# Patient Record
Sex: Female | Born: 1970 | Race: White | Hispanic: Yes | Marital: Married | Smoking: Never smoker
Health system: Southern US, Community
[De-identification: ages and names within clinical notes are randomized; demographics above are authoritative.]

## PROBLEM LIST (undated history)

## (undated) DIAGNOSIS — R519 Headache, unspecified: Secondary | ICD-10-CM

## (undated) DIAGNOSIS — I10 Essential (primary) hypertension: Secondary | ICD-10-CM

## (undated) DIAGNOSIS — A048 Other specified bacterial intestinal infections: Secondary | ICD-10-CM

## (undated) DIAGNOSIS — K219 Gastro-esophageal reflux disease without esophagitis: Secondary | ICD-10-CM

## (undated) DIAGNOSIS — R51 Headache: Secondary | ICD-10-CM

## (undated) DIAGNOSIS — D649 Anemia, unspecified: Secondary | ICD-10-CM

## (undated) DIAGNOSIS — N6320 Unspecified lump in the left breast, unspecified quadrant: Secondary | ICD-10-CM

## (undated) HISTORY — PX: OTHER SURGICAL HISTORY: SHX169

## (undated) HISTORY — DX: Other specified bacterial intestinal infections: A04.8

## (undated) HISTORY — DX: Gastro-esophageal reflux disease without esophagitis: K21.9

## (undated) HISTORY — DX: Headache: R51

## (undated) HISTORY — PX: ABDOMINAL HYSTERECTOMY: SHX81

## (undated) HISTORY — DX: Anemia, unspecified: D64.9

## (undated) HISTORY — DX: Headache, unspecified: R51.9

---

## 1999-03-01 ENCOUNTER — Other Ambulatory Visit: Admission: RE | Admit: 1999-03-01 | Discharge: 1999-03-01 | Payer: Self-pay | Admitting: Family Medicine

## 2000-12-18 ENCOUNTER — Encounter: Admission: RE | Admit: 2000-12-18 | Discharge: 2000-12-18 | Payer: Self-pay | Admitting: Family Medicine

## 2000-12-18 ENCOUNTER — Encounter: Payer: Self-pay | Admitting: Family Medicine

## 2001-05-18 ENCOUNTER — Other Ambulatory Visit: Admission: RE | Admit: 2001-05-18 | Discharge: 2001-05-18 | Payer: Self-pay | Admitting: Obstetrics and Gynecology

## 2001-08-18 ENCOUNTER — Other Ambulatory Visit: Admission: RE | Admit: 2001-08-18 | Discharge: 2001-08-18 | Payer: Self-pay | Admitting: Obstetrics and Gynecology

## 2001-12-23 ENCOUNTER — Encounter: Payer: Self-pay | Admitting: General Surgery

## 2001-12-23 ENCOUNTER — Encounter: Admission: RE | Admit: 2001-12-23 | Discharge: 2001-12-23 | Payer: Self-pay | Admitting: General Surgery

## 2006-04-25 ENCOUNTER — Other Ambulatory Visit: Admission: RE | Admit: 2006-04-25 | Discharge: 2006-04-25 | Payer: Self-pay | Admitting: Gynecology

## 2013-07-05 ENCOUNTER — Encounter (HOSPITAL_COMMUNITY): Payer: Self-pay | Admitting: Emergency Medicine

## 2013-07-05 ENCOUNTER — Emergency Department (HOSPITAL_COMMUNITY)
Admission: EM | Admit: 2013-07-05 | Discharge: 2013-07-05 | Disposition: A | Discharge status: Home / Self Care | Payer: BC Managed Care – PPO | Attending: Emergency Medicine | Admitting: Emergency Medicine

## 2013-07-05 DIAGNOSIS — N3090 Cystitis, unspecified without hematuria: Secondary | ICD-10-CM

## 2013-07-05 DIAGNOSIS — R110 Nausea: Secondary | ICD-10-CM | POA: Insufficient documentation

## 2013-07-05 DIAGNOSIS — Z90710 Acquired absence of both cervix and uterus: Secondary | ICD-10-CM | POA: Insufficient documentation

## 2013-07-05 LAB — URINE MICROSCOPIC-ADD ON

## 2013-07-05 LAB — URINALYSIS, ROUTINE W REFLEX MICROSCOPIC
Bilirubin Urine: NEGATIVE
Glucose, UA: NEGATIVE mg/dL
Ketones, ur: 15 mg/dL — AB
Nitrite: POSITIVE — AB
Protein, ur: 100 mg/dL — AB
Specific Gravity, Urine: 1.012 (ref 1.005–1.030)
Urobilinogen, UA: 1 mg/dL (ref 0.0–1.0)
pH: 5 (ref 5.0–8.0)

## 2013-07-05 MED ORDER — IBUPROFEN 200 MG PO TABS
400.0000 mg | ORAL_TABLET | Freq: Once | ORAL | Status: AC
  Administered 2013-07-05: 400 mg via ORAL
  Filled 2013-07-05: qty 2

## 2013-07-05 MED ORDER — ONDANSETRON 4 MG PO TBDP: ORAL_TABLET | ORAL | Status: DC

## 2013-07-05 MED ORDER — PHENAZOPYRIDINE HCL 200 MG PO TABS: 200.0000 mg | ORAL_TABLET | Freq: Three times a day (TID) | ORAL | Status: DC

## 2013-07-05 MED ORDER — ONDANSETRON 4 MG PO TBDP
4.0000 mg | ORAL_TABLET | Freq: Once | ORAL | Status: AC
  Administered 2013-07-05: 4 mg via ORAL
  Filled 2013-07-05: qty 1

## 2013-07-05 MED ORDER — NITROFURANTOIN MONOHYD MACRO 100 MG PO CAPS: 100.0000 mg | ORAL_CAPSULE | Freq: Two times a day (BID) | ORAL | Status: DC

## 2013-07-05 NOTE — ED Notes (Signed)
This RN spoke with lab, who states that the pt's urine sample opened while in transit, and they need another sample.

## 2013-07-05 NOTE — ED Provider Notes (Signed)
CSN: [id number]     Arrival date & time 07/05/13  0408 History   First MD Initiated Contact with Patient 07/05/13 [id number]     Chief Complaint  Patient presents with  . Urinary Tract Infection     (Consider location/radiation/quality/duration/timing/severity/associated sxs/prior Treatment) HPI Patient is a 43 year old female who presents with dysuria, frequency and suprapubic pain starting last evening. She's had mild nausea. She's had no vomiting. She has no fevers or chills. She has no flank pain. Patient denies any vaginal symptoms. She states she's had UTIs in the past with similar symptoms. History reviewed. No pertinent past medical history. Past Surgical History  Procedure Laterality Date  . Abdominal hysterectomy  Pt states 10 years ago.  . Cesarean section     History reviewed. No pertinent family history. History  Substance Use Topics  . Smoking status: Never Smoker   . Smokeless tobacco: Not on file  . Alcohol Use: No   OB History   Grav Para Term Preterm Abortions TAB SAB Ect Mult Living                 Review of Systems  Constitutional: Negative for fever and chills.  Gastrointestinal: Positive for nausea. Negative for vomiting, abdominal pain, diarrhea and constipation.  Genitourinary: Positive for dysuria and frequency. Negative for hematuria, flank pain, vaginal bleeding, vaginal discharge and pelvic pain.  Musculoskeletal: Negative for back pain, myalgias, neck pain and neck stiffness.  Skin: Negative for rash and wound.  Neurological: Negative for dizziness, weakness, light-headedness, numbness and headaches.  All other systems reviewed and are negative.     Allergies  Review of patient's allergies indicates no known allergies.  Home Medications   Prior to Admission medications   Not on File   BP 135/90  Pulse 78  Temp(Src) 98.6 F (37 C) (Oral)  Ht 5\' 4"  (1.626 m)  Wt 150 lb (68.04 kg)  BMI 25.73 kg/m2  SpO2 98% Physical Exam  Nursing note and  vitals reviewed. Constitutional: She is oriented to person, place, and time. She appears well-developed and well-nourished. No distress.  HENT:  Head: Normocephalic and atraumatic.  Mouth/Throat: Oropharynx is clear and moist.  Eyes: EOM are normal. Pupils are equal, round, and reactive to light.  Neck: Normal range of motion. Neck supple.  Cardiovascular: Normal rate and regular rhythm.   Pulmonary/Chest: Effort normal and breath sounds normal. No respiratory distress. She has no wheezes. She has no rales.  Abdominal: Soft. Bowel sounds are normal. She exhibits no distension and no mass. There is tenderness (mild suprapubic tenderness.). There is no rebound and no guarding.  Musculoskeletal: Normal range of motion. She exhibits no edema and no tenderness.  No CVA tenderness bilaterally.  Neurological: She is alert and oriented to person, place, and time.  Skin: Skin is warm and dry. No rash noted. No erythema.  Psychiatric: She has a normal mood and affect. Her behavior is normal.    ED Course  Procedures (including critical care time) Labs Review Labs Reviewed  URINALYSIS, ROUTINE W REFLEX MICROSCOPIC - Abnormal; Notable for the following:    Color, Urine ORANGE (*)    APPearance TURBID (*)    Hgb urine dipstick LARGE (*)    Ketones, ur 15 (*)    Protein, ur 100 (*)    Nitrite POSITIVE (*)    Leukocytes, UA LARGE (*)    All other components within normal limits  URINE MICROSCOPIC-ADD ON - Abnormal; Notable for the following:    Bacteria,  UA FEW (*)    All other components within normal limits    Imaging Review No results found.   EKG Interpretation None      MDM   Final diagnoses:  Cystitis   We'll treat patient for urinary tract infection. Return precautions given     Loren Raceravid Rhilee Currin, MD 07/13/13 [id number]

## 2013-07-05 NOTE — Discharge Instructions (Signed)
Urinary Tract Infection  Urinary tract infections (UTIs) can develop anywhere along your urinary tract. Your urinary tract is your body's drainage system for removing wastes and extra water. Your urinary tract includes two kidneys, two ureters, a bladder, and a urethra. Your kidneys are a pair of bean-shaped organs. Each kidney is about the size of your fist. They are located below your ribs, one on each side of your spine.  CAUSES  Infections are caused by microbes, which are microscopic organisms, including fungi, viruses, and bacteria. These organisms are so small that they can only be seen through a microscope. Bacteria are the microbes that most commonly cause UTIs.  SYMPTOMS   Symptoms of UTIs may vary by age and gender of the patient and by the location of the infection. Symptoms in young women typically include a frequent and intense urge to urinate and a painful, burning feeling in the bladder or urethra during urination. Older women and men are more likely to be tired, shaky, and weak and have muscle aches and abdominal pain. A fever may mean the infection is in your kidneys. Other symptoms of a kidney infection include pain in your back or sides below the ribs, nausea, and vomiting.  DIAGNOSIS  To diagnose a UTI, your caregiver will ask you about your symptoms. Your caregiver also will ask to provide a urine sample. The urine sample will be tested for bacteria and white blood cells. White blood cells are made by your body to help fight infection.  TREATMENT   Typically, UTIs can be treated with medication. Because most UTIs are caused by a bacterial infection, they usually can be treated with the use of antibiotics. The choice of antibiotic and length of treatment depend on your symptoms and the type of bacteria causing your infection.  HOME CARE INSTRUCTIONS   If you were prescribed antibiotics, take them exactly as your caregiver instructs you. Finish the medication even if you feel better after you  have only taken some of the medication.   Drink enough water and fluids to keep your urine clear or pale yellow.   Avoid caffeine, tea, and carbonated beverages. They tend to irritate your bladder.   Empty your bladder often. Avoid holding urine for long periods of time.   Empty your bladder before and after sexual intercourse.   After a bowel movement, women should cleanse from front to back. Use each tissue only once.  SEEK MEDICAL CARE IF:    You have back pain.   You develop a fever.   Your symptoms do not begin to resolve within 3 days.  SEEK IMMEDIATE MEDICAL CARE IF:    You have severe back pain or lower abdominal pain.   You develop chills.   You have nausea or vomiting.   You have continued burning or discomfort with urination.  MAKE SURE YOU:    Understand these instructions.   Will watch your condition.   Will get help right away if you are not doing well or get worse.  Document Released: 12/12/2004 Document Revised: 09/03/2011 Document Reviewed: 04/12/2011  [employer] Patient Information 2014 [employer]

## 2013-07-05 NOTE — ED Notes (Signed)
Pt states that she started having pain with urination and blood in her urine last night. Pt denies flank pain.

## 2013-11-02 ENCOUNTER — Ambulatory Visit (INDEPENDENT_AMBULATORY_CARE_PROVIDER_SITE_OTHER): Payer: BC Managed Care – PPO | Admitting: Gynecology

## 2013-11-02 ENCOUNTER — Encounter: Payer: Self-pay | Admitting: Gynecology

## 2013-11-02 ENCOUNTER — Other Ambulatory Visit (HOSPITAL_COMMUNITY)
Admission: RE | Admit: 2013-11-02 | Discharge: 2013-11-02 | Disposition: A | Discharge status: Home / Self Care | Payer: BC Managed Care – PPO | Source: Ambulatory Visit | Attending: Gynecology | Admitting: Gynecology

## 2013-11-02 ENCOUNTER — Telehealth: Payer: Self-pay | Admitting: *Deleted

## 2013-11-02 VITALS — BP 118/74 | Ht 62.75 in | Wt 151.4 lb

## 2013-11-02 DIAGNOSIS — N390 Urinary tract infection, site not specified: Secondary | ICD-10-CM

## 2013-11-02 DIAGNOSIS — N3281 Overactive bladder: Secondary | ICD-10-CM

## 2013-11-02 DIAGNOSIS — Z1151 Encounter for screening for human papillomavirus (HPV): Secondary | ICD-10-CM | POA: Insufficient documentation

## 2013-11-02 DIAGNOSIS — N760 Acute vaginitis: Secondary | ICD-10-CM | POA: Insufficient documentation

## 2013-11-02 DIAGNOSIS — R229 Localized swelling, mass and lump, unspecified: Secondary | ICD-10-CM

## 2013-11-02 DIAGNOSIS — R2232 Localized swelling, mass and lump, left upper limb: Secondary | ICD-10-CM

## 2013-11-02 DIAGNOSIS — Z01419 Encounter for gynecological examination (general) (routine) without abnormal findings: Secondary | ICD-10-CM | POA: Diagnosis not present

## 2013-11-02 DIAGNOSIS — R351 Nocturia: Secondary | ICD-10-CM

## 2013-11-02 DIAGNOSIS — N6310 Unspecified lump in the right breast, unspecified quadrant: Secondary | ICD-10-CM

## 2013-11-02 DIAGNOSIS — N318 Other neuromuscular dysfunction of bladder: Secondary | ICD-10-CM

## 2013-11-02 DIAGNOSIS — H109 Unspecified conjunctivitis: Secondary | ICD-10-CM

## 2013-11-02 DIAGNOSIS — N63 Unspecified lump in unspecified breast: Secondary | ICD-10-CM

## 2013-11-02 LAB — CBC WITH DIFFERENTIAL/PLATELET
BASOS ABS: 0 10*3/uL (ref 0.0–0.1)
BASOS PCT: 0 % (ref 0–1)
EOS PCT: 1 % (ref 0–5)
Eosinophils Absolute: 0.1 10*3/uL (ref 0.0–0.7)
HEMATOCRIT: 40.9 % (ref 36.0–46.0)
Hemoglobin: 14.1 g/dL (ref 12.0–15.0)
LYMPHS PCT: 37 % (ref 12–46)
Lymphs Abs: 2.1 10*3/uL (ref 0.7–4.0)
MCH: 30.1 pg (ref 26.0–34.0)
MCHC: 34.5 g/dL (ref 30.0–36.0)
MCV: 87.2 fL (ref 78.0–100.0)
Monocytes Absolute: 0.4 10*3/uL (ref 0.1–1.0)
Monocytes Relative: 7 % (ref 3–12)
Neutro Abs: 3.1 10*3/uL (ref 1.7–7.7)
Neutrophils Relative %: 55 % (ref 43–77)
Platelets: 246 10*3/uL (ref 150–400)
RBC: 4.69 MIL/uL (ref 3.87–5.11)
RDW: 13.5 % (ref 11.5–15.5)
WBC: 5.6 10*3/uL (ref 4.0–10.5)

## 2013-11-02 LAB — URINALYSIS W MICROSCOPIC + REFLEX CULTURE
Bilirubin Urine: NEGATIVE
GLUCOSE, UA: NEGATIVE mg/dL
Hgb urine dipstick: NEGATIVE
Ketones, ur: NEGATIVE mg/dL
Leukocytes, UA: NEGATIVE
NITRITE: NEGATIVE
Protein, ur: NEGATIVE mg/dL
Specific Gravity, Urine: <1.005 — ABNORMAL LOW (ref 1.005–1.030)
UROBILINOGEN UA: 0.2 mg/dL (ref 0.0–1.0)
pH: 7 (ref 5.0–8.0)

## 2013-11-02 LAB — COMPREHENSIVE METABOLIC PANEL
ALT: 26 U/L (ref 0–35)
AST: 26 U/L (ref 0–37)
Albumin: 4.5 g/dL (ref 3.5–5.2)
Alkaline Phosphatase: 40 U/L (ref 39–117)
BUN: 9 mg/dL (ref 6–23)
CHLORIDE: 103 meq/L (ref 96–112)
CO2: 28 mEq/L (ref 19–32)
CREATININE: 0.61 mg/dL (ref 0.50–1.10)
Calcium: 9.7 mg/dL (ref 8.4–10.5)
Glucose, Bld: 84 mg/dL (ref 70–99)
Potassium: 4.9 mEq/L (ref 3.5–5.3)
Sodium: 138 mEq/L (ref 135–145)
Total Bilirubin: 0.5 mg/dL (ref 0.2–1.2)
Total Protein: 7.4 g/dL (ref 6.0–8.3)

## 2013-11-02 LAB — WET PREP FOR TRICH, YEAST, CLUE
Clue Cells Wet Prep HPF POC: NONE SEEN
Trich, Wet Prep: NONE SEEN

## 2013-11-02 LAB — CHOLESTEROL, TOTAL: CHOLESTEROL: 189 mg/dL (ref 0–200)

## 2013-11-02 MED ORDER — AZITHROMYCIN 1 % OP SOLN: OPHTHALMIC | Status: DC

## 2013-11-02 NOTE — Telephone Encounter (Signed)
Message copied by Aura CampsWEBB, Malavika Lira L on Tue Nov 02, 2013  2:32 PM ------      Message from: Ok EdwardsFERNANDEZ, JUAN H      Created: Tue Nov 02, 2013 11:40 AM       Please [facility] diagnostic mamm[facility]:            Right Breast mass 2 cm in size firm and tender 1-3 o'clock position periareolar region.            Also, need appointment with general surgeon for soft tissue mass left shoulder area to rule out lymphoma ------

## 2013-11-02 NOTE — Patient Instructions (Addendum)
Conjuntivitis (Conjunctivitis) Usted padece conjuntivitis. La conjuntivitis se conoce frecuentemente como "ojo rojo". Las causas de la conjuntivitis pueden ser las infecciones virales o [place], Environmental consultant o lesiones. Los sntomas son: enrojecimiento de la superficie del ojo, picazn, molestias y en algunos casos, secreciones. La secrecin se deposita en las pestaas. Las infecciones virales causan una secrecin acuosa, mientras que las infecciones bacterianas causan una secrecin amarillenta y espesa. La conjuntivitis es muy contagiosa y se disemina por el contacto directo. [employer] Energy parte del tratamiento le indicaran gotas oftlmicas con antibiticos. Antes de [employer] Corporation, retire todas la secreciones del ojo, lavndolo suavemente con agua tibia y algodn. Contine con el uso del medicamento hasta que se haya Entergy Corporation sin secrecin ocular. No se frote los ojos. Esto hace que aumente la irritacin y favorece la extensin de la infeccin. No utilice las Lear Corporation miembros de [place]. Lvese las manos con agua y [place] antes y despus de tocarse los ojos. Utilice compresas fras para reducir Chief Technology Officer y anteojos de sol para disminuir la irritacin que ocasiona la luz. No debe usarse maquillaje ni lentes de contacto hasta que la infeccin haya desaparecido. SOLICITE ATENCIN MDICA SI:  Sus sntomas no mejoran luego de [address]  [address] de [place].  Aumenta el dolor o las dificultades para ver.  La zona externa de los prpados est muy roja o hinchada. Document Released: 03/04/2005 Document Revised: 05/27/2011 [facility] Patient Information 2015 [place], [place]. This information is not intended to replace advice given to you by your health care provider. Make sure you discuss any questions you have with your health care provider. Autoexamen de [employer]  Chief Executive Officer) El autoexamen de mamas puede detectar problemas de manera temprana, prevenir complicaciones mdicas  significativas y posiblemente salvar su vida. Al hacerlo, podr familiarizarse con el aspecto y forma de sus [place], y observar cambios. Esto le permite descubrir cambios de manera precoz. Este autoexamen Murphy Oil ofrece la tranquilidad de que sus senos estn en buen [place] de [place]. Una forma de aprender qu es normal para sus mamas y si sufren modificaciones es Radio producer un autoexamen.   Si encuentra un bulto o algo que no estaba presente anteriormente, lo mejor es ponerse en contacto con su mdico inmediatamente. Otro hallazgo que debe ser evaluado por su mdico es la secrecin del pezn, especialmente si es con sangre; cambios en la piel o enrojecimiento; reas donde la piel parece estar tironeada (retrada) o nuevos bultos o protuberancias. El dolor en los senos es rara vez se asocia con el cncer (malignidad), pero tambin debe ser evaluado por un mdico.  CMO REALIZAR EL AUTOEXAMEN DE MAMAS  El mejor momento para examinar sus mamas es a los 5 a 7 das despus de finalizado el perodo menstrual. Durante la menstruacin, las mamas estn ms abultadas y puede haber ms dificultad para Clinical research associate modificaciones. Si no menstra, ha llegado a la menopausia, o le han extirpado el tero (histerectomia), usted debe examinar sus senos a intervalos regulares, por ejemplo cada mes. Si est amamantando, examine sus senos despus de alimentar al beb o despus de usar un extractor de [place]. Los implantes mamarios no disminuyen el riesgo de bultos o tumores, por lo que debe seguir realizando el autoexamen de [employer] se recomienda. Hable con su mdico acerca de cmo determinar la diferencia entre el implante y el tejido [place]. [place]ems, debe consultar cuanta presin debe hacer durante el examen. Con el tiempo se familiarizar con las variaciones de las [place] y se sentir  ms cmoda para Horticulturist, commercial. Para el autoexamen deber quitarse toda la ropa de la cintura para [place].  1.  Observe sus senos y pezones. Prese  frente a un espejo en una habitacin con buena iluminacin. Con las [employer] caderas, presione las manos firmemente [place]. Busque diferencias en la forma, el contorno y el tamao de un pecho al otro (asimetras). Entre las asimetras se incluyen arrugas, depresiones o protuberancias. Tambin, busque cambios en la piel, como reas enrojecidas o escamosas. Busque cambios en los pezones, como secreciones, hoyuelos, cambios en la posicin, o enrojecimiento. 2. [place] cuidadosamente sus senos. Es mucho mejor D[employer] Restaurants en la ducha o en la baera, [place] jabn o cuando est recostada sobre su espalda. Coloque el brazo (en el lado de la mama que se examina) por arriba de la cabeza. Use las yemas (no las puntas) de los tres dedos centrales de la mano opuesta para palpar. Comience en la zona de la axila, haga crculos de  de pulgada (2 cm) y vaya superponindolos. Utilice 3 niveles diferentes de presin (ligero, medio y [place]) en cada crculo antes de pasar al siguiente. Se necesita una presin ligera para sentir los tejidos ms cercanos a la piel. La presin media ayudar a sentir el tejido [employer]apeake Energy un poco ms profundo, mientras que se necesita una presin firme para palpar el tejido que se encuentra cerca de las [place]. Continuar superponiendo crculos y vaya hacia abajo, hasta sentir las [place], por debajo del [place]. Luego mueva un espacio del ancho de un dedo hacia el centro del cuerpo. Siga con los crculos del  de pulgada (2 cm) mientras va lentamente hacia la clavcula, cerca de la base del cuello. Contine con el examen hacia arriba y hacia abajo con las 3 intensidades de presin Civil Service fast streamer a la mitad del pecho. Hgalo con cada seno cuidadosamente, buscando bultos o modificaciones. 3. Debe llevar un registro escrito con los cambios o los hallazgos normales que encuentre para cada seno. Si registra esta informacin, no tiene que depender slo de la memoria para Designer, industrial/product, la  sensibilidad o la ubicacin de los Brillion. [place] en qu momento se encuentra del ciclo menstrual, si usted todava est menstruando. El tejido [employer]apeake Energy puede tener algunos bultos o tejidos engrosados. Sin embargo, consulte a su mdico si usted Animal nutritionist.   SOLICITE ATENCIN MDICA SI:   Observa cambios en la forma, en el contorno o el tamao de las mamas o los pezones.   Hay modificaciones en la piel, como zonas enrojecidas o escamosas en las mamas o en los pezones.   Tiene una secrecin anormal en los pezones.   Siente un nuevo bulto o reas engrosadas de [place] anormal.  Document Released: 03/04/2005 Document Revised: 02/19/2012 [facility] Patient Information 2015 [place], [place]. This information is not intended to replace advice given to you by your health care provider. Make sure you discuss any questions you have with your health care provider.  Poliaquiuria  (Urinary Frequency)  El nmero de veces que orina una persona sana depende de la cantidad de lquido que ingiere y la cantidad de lquido que pierde. Cuando hace calor y hay mucha humedad, la persona transpira ms y la respiracin es ms rpida. Estos factores disminuyen la frecuencia de la miccin que se considera normal.  La cantidad de lquido que se bebe es fcil de Chief Strategy Officer, pero la cantidad de lquido que se pierde es ms difcil de calcular.  El lquido se pierde  en dos formas:   La prdida perceptible de lquidos se mide por la cantidad de orina que se elimina. Tambin hay prdida de lquido tambin con la diarrea.  La prdida imperceptible de lquidos es ms difcil de medir. La causa es la evaporacin. La prdida insensible de lquido se produce a travs de la respiracin y la sudoracin. Por lo general oscila alrededor de Building services engineer. En temperaturas y niveles de [place] normales una persona promedio puede orinar 4-7 veces en un perodo de 24 horas. La necesidad de orinar con ms  frecuencia podra indicar que hay un problema. Si una persona orina entre 4 y 7 veces en 24 horas y elimina grandes volmenes cada vez, podra indicar un problema diferente del que orine 4 a 7 veces al da y elimine pequeos volmenes. El momento en que orina tambin es importante. Generalmente se [employer] de vigilia. Levantarse con frecuencia por la noche puede indicar algunos problemas.  CAUSAS  La vejiga es el rgano que se encuentra en la zona baja del abdomen y que contiene la [place]. Como un globo, se hincha un poco a medida que se llena. Las terminales nerviosas lo perciben y envan la informacin de que es hora de ir al bao. Hay una serie de causas por las que se siente la necesidad de Geographical information systems officer con ms frecuencia de lo habitual. Ellos son:   Infeccin del tracto urinario. Esto generalmente se asocia con otros sntomas tales como ardor al [employer]ocoPhillips.  En hombres, los problemas de la prstata (una glndula del tamao de una nuez que se encuentra cerca del conducto por el que se elimina la orina del organismo). Hay dos razones por las que la prstata puede causar un aumento en la frecuencia de la miccin:  El agrandamiento de la prstata que no deja que la vejiga se vace bien. Si la vejiga se vaca a medias slo tiene la mitad de la capacidad de llenarla antes de orinar de [place].  Los nervios de la vejiga se vuelven ms hipersensibles a un aumento del tamao de la prstata, incluso si la vejiga se vaca completamente.  Embarazo.  Obesidad. El exceso de peso causa ms problemas en las mujeres que en los hombres.  Clculos u otros problemas en la vejiga.  La cafena.  Alcohol.  Medicamentos. Por ejemplo, los medicamentos que permiten eliminar el exceso de lquido del organismo (diurticos) aumentan la produccin de [place]. Algunos medicamentos deben tomarse con mucho lquido.  Debilidad muscular o nerviosa. Podra ser el resultado de una lesin en la mdula espinal, un ictus,  esclerosis mltiple o la enfermedad de Parkinson.  Cuando se ha sufrido diabetes durante mucho tiempo, puede haber una disminucin de la sensacin de la vejiga. Esta prdida de sensibilidad hace que sea ms difcil detectar que hay que vaciar la vejiga. Durante muchos aos la vejiga se expande por sobrellenado constante. Esto debilita sus msculos de modo que la vejiga no se vaca bien y tiene menos capacidad para llenarse nuevamente.  Cistitis intersticial (tambin llamado sndrome de vejiga dolorosa). La enfermedad se desarrolla debido a que los tejidos que recubren el interior de la vejiga se inflaman (la inflamacin es la manera que tiene el organismo de Publishing rights manager a una lesin o infeccin). Causa dolor y necesidad frecuente de Geographical information systems officer. Ocurre con ms frecuencia en mujeres que en hombres. DIAGNSTICO   Para diagnosticar la causa de la poliaquiuria, el mdico:  Le preguntar [employer] Station[employer] sntomas.  Preguntar acerca de su salud en  general. Incluir preguntas sobre los medicamentos que est tomando.  Le har un examen fsico.  Indicar algunos estudios. Estos pueden incluir:  Un anlisis de sangre para Engineer, manufacturing diabetes u otros problemas de salud que podran estar contribuyendo al problema.  Anlisis de [place]. Anlisis de [place] para medir el flujo de [place] y la presin sobre la vejiga.  Estudios del sistema neurolgico (el cerebro, la mdula espinal y los nervios). Este es el sistema que detecta la sensacin de Geographical information systems officer.  Estudios de la vejiga para verificar si se vaca por completo al [employer]ocoPhillips.  [place]. En este estudio se [place] un tubo delgado con una pequea cmara. Podr observarse el interior de la uretra y la vejiga para ver si hay problemas.  Diagnstico por imgenes. Le administrarn una sustancia de contraste y [employer] pedirn que orine Luego se toman radiografas de la vejiga para ver su funcionamiento. TRATAMIENTO  Es importante que sea evaluado para determinar si la cantidad  o la frecuencia con la que orina es anormal o inusual. Si se comprueba que no es normal, debe determinarse la causa y por lo general se puede hallar con facilidad. Segn sea la causa, el tratamiento podra incluir medicamentos, la estimulacin de los nervios o [place].  No hay muchas cosas que usted pueda hacer por su cuenta para modificar la poliaquiuria. Es importante equilibrar la ingestin de lquidos necesaria para compensar su actividad y Retail buyer. Los problemas mdicos sern diagnosticados y tratados por su mdico. No hay entrenamiento de la vejiga en particular, como los ejercicios de [place] que usted pueda hacer para mejorar la poliaquiuria. Este es un ejercicio que normalmente deben Ameren Corporation personas que tienen prdidas de [place] cuando se ren, tosen o estornudan.  INSTRUCCIONES PARA EL CUIDADO EN EL HOGAR   Tome todos los medicamentos que su mdico le ha recetado o sugerido. Siga cuidadosamente las indicaciones.  Realice cambios en su estilo de vida, segn las indicaciones. Estos pueden incluir:  Product manager menos lquidos o beber en diferentes momentos del da. Por ejemplo, si necesita orinar con frecuencia durante la noche, deber dejar de tomar lquidos temprano a la noche.  Reducir el consumo de cafena o de alcohol. Ambos aumentan la necesidad de orinar ms de lo normal. La cafena se encuentra en las gaseosas, el t y el chocolate.  Baje de peso, si se lo indican.  Lleve un diario o registro. Posiblemente le pedirn que registre la cantidad que bebe y [place], y el momento en que sienta la necesidad de Geographical information systems officer. Esto tambin lo ayudar a evaluar si el tratamiento que le ha indicado el mdico funciona. SOLICITE ATENCIN MDICA SI:   La necesidad de orinar con frecuencia aumenta.  Se siente ms dolor o irritacin al [employer]oco[employer]illips.  Observa sangre en la orina.  Usted tiene preguntas sobre cualquier medicamento que su mdico ha recomendado.  Observa sangre, pus, aumento de la  [employer] lugares en que le hicieron las pruebas o el [place].  La temperatura eleva por encima de 100,68F (38,1C). SOLICITE ATENCIN MDICA DE INMEDIATO SI:  La temperatura eleva a ms de 102,68F (38,9C).  Document Released: 11/27/2011 [facility] Patient Information 2015 [place], [place]. This information is not intended to replace advice given to you by your health care provider. Make sure you discuss any questions you have with your health care provider.  Tdap Vaccine (Tetanus, Diphtheria, Pertussis): What You Need to Know 1. Why get vaccinated? Tetanus, diphtheria and pertussis can be very serious diseases, even for adolescents and  adults. Tdap vaccine can protect [place] from these diseases. TETANUS (Lockjaw) causes painful muscle tightening and stiffness, usually all over the body.  It can lead to tightening of muscles in the head and neck so you can't open your mouth, swallow, or sometimes even breathe. Tetanus kills about 1 out of 5 people who are infected. DIPHTHERIA can cause a thick coating to form in the back of the throat.  It can lead to breathing problems, paralysis, heart failure, and death. PERTUSSIS (Whooping Cough) causes severe coughing spells, which can cause difficulty breathing, vomiting and disturbed sleep.  It can also lead to weight loss, incontinence, and rib fractures. Up to 2 in 100 adolescents and 5 in 100 adults with pertussis are hospitalized or have complications, which could include pneumonia or death. These diseases are caused by bacteria. Diphtheria and pertussis are spread from person to person through coughing or sneezing. Tetanus enters the body through cuts, scratches, or wounds. Before vaccines, the [place] States saw as many as 200,000 cases a year of diphtheria and pertussis, and hundreds of cases of tetanus. Since vaccination began, tetanus and diphtheria have dropped by about 99% and pertussis by about 80%. 2. Tdap vaccine Tdap vaccine can  protect adolescents and adults from tetanus, diphtheria, and pertussis. One dose of Tdap is routinely given at age 36 or 42. People who did not get Tdap at that age should get it as soon as possible. Tdap is especially important for health care professionals and anyone having close contact with a baby younger than 12 months. Pregnant women should get a dose of Tdap during every pregnancy, to protect the newborn from pertussis. Infants are most at risk for severe, life-threatening complications from pertussis. A similar vaccine, called Td, protects from tetanus and diphtheria, but not pertussis. A Td booster should be given every 10 years. Tdap may be given as one of these boosters if you have not already gotten a dose. Tdap may also be given after a severe cut or burn to prevent tetanus infection. Your doctor can give you more information. Tdap may safely be given at the same time as other vaccines. 3. Some people should not get this vaccine  If you ever had a life-threatening allergic reaction after a dose of any tetanus, diphtheria, or pertussis containing vaccine, OR if you have a severe allergy to any part of this vaccine, you should not get Tdap. Tell your doctor if you have any severe allergies.  If you had a coma, or long or multiple seizures within 7 days after a childhood dose of DTP or DTaP, you should not get Tdap, unless a cause other than the vaccine was found. You can still get Td.  Talk to your doctor if you:  have epilepsy or another nervous system problem,  had severe pain or swelling after any vaccine containing diphtheria, tetanus or pertussis,  ever had Guillain-Barr Syndrome (GBS),  aren't feeling well on the day the shot is scheduled. 4. Risks of a vaccine reaction With any medicine, including vaccines, there is a chance of side effects. These are usually mild and go away on their own, but serious reactions are also possible. Brief fainting spells can follow a  vaccination, leading to injuries from falling. Sitting or lying down for about 15 minutes can help prevent these. Tell your doctor if you feel dizzy or light-headed, or have vision changes or ringing in the ears. Mild problems following Tdap (Did not interfere with activities)  Pain where the shot  was given (about 3 in 4 adolescents or 2 in 3 adults)  Redness or swelling where the shot was given (about 1 person in 5)  Mild fever of at least 100.77F (up to about 1 in 25 adolescents or 1 in 100 adults)  Headache (about 3 or 4 people in 10)  Tiredness (about 1 person in 3 or 4)  Nausea, vomiting, diarrhea, stomach ache (up to 1 in 4 adolescents or 1 in 10 adults)  Chills, body aches, sore joints, rash, swollen glands (uncommon) Moderate problems following Tdap (Interfered with activities, but did not require medical attention)  Pain where the shot was given (about 1 in 5 adolescents or 1 in 100 adults)  Redness or swelling where the shot was given (up to about 1 in 16 adolescents or 1 in 25 adults)  Fever over 102F (about 1 in 100 adolescents or 1 in 250 adults)  Headache (about 3 in 20 adolescents or 1 in 10 adults)  Nausea, vomiting, diarrhea, stomach ache (up to 1 or 3 people in 100)  Swelling of the entire arm where the shot was given (up to about 3 in 100). Severe problems following Tdap (Unable to perform usual activities; required medical attention)  Swelling, severe pain, bleeding and redness in the arm where the shot was given (rare). A severe allergic reaction could occur after any vaccine (estimated less than 1 in a million doses). 5. What if there is a serious reaction? What should I look for?  Look for anything that concerns you, such as signs of a severe allergic reaction, very high fever, or behavior changes. Signs of a severe allergic reaction can include hives, swelling of the face and throat, difficulty breathing, a fast heartbeat, dizziness, and weakness.  These would start a few minutes to a few hours after the vaccination. What should I do?  If you think it is a severe allergic reaction or other emergency that can't wait, call 9-1-1 or get the person to the nearest hospital. Otherwise, call your doctor.  Afterward, the reaction should be reported to the "Vaccine Adverse Event Reporting System" (VAERS). Your doctor might file this report, or you can do it yourself through the VAERS web site at www.vaers.LAgents.nohhs.gov, or by calling 1-931-850-6589. VAERS is only for reporting reactions. They do not give medical advice.  6. The National Vaccine Injury Compensation Program The Constellation Energyational Vaccine Injury Compensation Program (VICP) is a federal program that was created to compensate people who may have been injured by certain vaccines. Persons who believe they may have been injured by a vaccine can learn about the program and about filing a claim by calling 1-(207)215-4201 or visiting the VICP website at SpiritualWord.atwww.hrsa.gov/vaccinecompensation. 7. How can I learn more?  Ask your doctor.  Call your local or state health department.  Contact the Centers for Disease Control and Prevention (CDC):  Call (309) [phone])199-5107 or visit CDC's website at PicCapture.uywww.cdc.gov/vaccines. CDC Tdap Vaccine VIS (07/25/11) Document Released: 09/03/2011 Document Revised: 07/19/2013 Document Reviewed: 06/16/2013 ExitCare Patient Information 2015 [place]. This information is not intended to replace advice given to you by your health care provider. Make sure you discuss any questions you have with your health care provider.

## 2013-11-02 NOTE — Progress Notes (Signed)
Christie Stone 03/26/1970 [id number]   History:    43 y.o.  for annual gyn exam who has not been seen in the office since 2008. She has not been seen by any other provider since that time. She had several complaints today as follows:  Right eye irritation Left shoulder mass Vaginal discharge and irritation Urinary frequency and nocturia Past history of recurrent UTI.  Patient stated in 2008 she [place] secondary to dysmenorrhea, menorrhagia and fibroid uterus.  Patient stated that she had a [facility] in [facility] but cannot understand the report and any recommendations for followup studies because of language barrier.  Past medical history,surgical history, family history and social history were all reviewed and documented in the EPIC chart.  Gynecologic History No LMP recorded. Patient has had a hysterectomy. Contraception: status post hysterectomy Last Pap: 2008. Results were: normal Last mammogram: 2015. Results were: Do not have results  Obstetric History OB History  Gravida Para Term Preterm AB SAB TAB Ectopic Multiple Living  2 2        2     # Outcome Date GA Lbr Len/2nd Weight Sex Delivery Anes PTL Lv  2 PAR           1 PAR                ROS: A ROS was performed and pertinent positives and negatives are included in the history.  GENERAL: No fevers or chills. HEENT: Right eye irritation NECK: No pain or stiffness. CARDIOVASCULAR: No chest pain or pressure. No palpitations. PULMONARY: No shortness of breath, cough or wheeze. GASTROINTESTINAL: No abdominal pain, nausea, vomiting or diarrhea, melena or bright red blood per rectum. GENITOURINARY: No urinary frequency, urgency, hesitancy or dysuria. MUSCULOSKELETAL: No joint or muscle pain, no back pain, no recent trauma. DERMATOLOGIC: No rash, no itching, no lesions. ENDOCRINE: No polyuria, polydipsia, no heat or cold intolerance. No recent change in weight. HEMATOLOGICAL: No  anemia or easy bruising or bleeding. NEUROLOGIC: No headache, seizures, numbness, tingling or weakness. PSYCHIATRIC: No depression, no loss of interest in normal activity or change in sleep pattern.  Left shoulder mass   Exam: chaperone present  BP 118/74  Ht 5' 2.75" (1.594 m)  Wt 151 lb 6.4 oz (68.675 kg)  BMI 27.03 kg/m2  Body mass index is 27.03 kg/(m^2).  General appearance : Well developed well nourished female. No acute distress HEENT: Left shoulder mass 2 x 3 cm firm, , trachea midline, no carotid bruits, no thyroidmegaly Lungs: Clear to auscultation, no rhonchi or wheezes, or rib retractions, right eye conjunctivitis Heart: Regular rate and rhythm, no murmurs or gallops Breast: Right breast mass 1:00 position a periareolar region 2-1/2 cm in size firm and tender  Abdomen: no palpable masses or tenderness, no rebound or guarding Extremities: no edema or skin discoloration or tenderness  Pelvic:  Bartholin, Urethra, Skene Glands: Within normal limits             Vagina: No gross lesions or discharge  Cervix: Absent  Uterus  Absent  Adnexa  Without masses or tenderness  Anus and perineum  normal   Rectovaginal  normal sphincter tone without palpated masses or tenderness             Hemoccult not indicated     Assessment/Plan:  43 y.o. female for annual exam with several findings: #1 right eye conjunctivitis will be prescribed Azithromycin 1% ophthalmic solution to apply to both eyes  twice a day for 2 days followed by one drop each eye for 5 days. #2 patient will be sent to the radiologist for diagnostic mammogram and ultrasound of right breast mass #3 patient will be referred to the general surgeon for further evaluation of left shoulder mass rule out lymphoma #4 stress today showed yeast so she will be prescribed Diflucan 150 mg one by mouth. #5 the following labs were ordered: CBC, screen cholesterol, comprehensive metabolic panel, TSH, urinalysis and Pap smear. #6 Tdap  vaccine administered today. #7 patient's with symptoms of overactive bladder but does consume large quantities of fluid so I have asked her to maintain a bleeding calendar for at least one month and then return for followup.  Note: This dictation was prepared with  Dragon/digital dictation along withSmart phrase technology. Any transcriptional errors that result from this process are unintentional.   Ok Edwards MD, 12:45 PM 11/02/2013

## 2013-11-02 NOTE — Addendum Note (Signed)
Addended by: Richardson ChiquitoWILKINSON, KARI S on: 11/02/2013 04:36 PM   Modules accepted: Orders

## 2013-11-02 NOTE — Telephone Encounter (Signed)
Order placed for breast center they will contact pt to schedule, referral placed for [facility] [place] [facility] for soft tissue mass as well they will also contact pt to schedule.

## 2013-11-03 LAB — TSH: TSH: 1.999 u[IU]/mL (ref 0.350–4.500)

## 2013-11-03 NOTE — Telephone Encounter (Signed)
APPOINTMENT ON 11/16/13 @ 9:10 AM WITH DR. TOTH AT [facility]

## 2013-11-04 LAB — CYTOLOGY - PAP

## 2013-11-05 ENCOUNTER — Encounter: Payer: Self-pay | Admitting: Gynecology

## 2013-11-11 ENCOUNTER — Encounter: Payer: Self-pay | Admitting: Gynecology

## 2013-11-12 NOTE — Telephone Encounter (Signed)
Appointment on 11/18/13 @ 3:00pm at breast center

## 2013-11-16 ENCOUNTER — Ambulatory Visit (INDEPENDENT_AMBULATORY_CARE_PROVIDER_SITE_OTHER): Payer: BC Managed Care – PPO | Admitting: General Surgery

## 2013-11-18 ENCOUNTER — Ambulatory Visit
Admission: RE | Admit: 2013-11-18 | Discharge: 2013-11-18 | Disposition: A | Discharge status: Home / Self Care | Payer: BC Managed Care – PPO | Source: Ambulatory Visit | Attending: Gynecology | Admitting: Gynecology

## 2013-11-18 DIAGNOSIS — N6310 Unspecified lump in the right breast, unspecified quadrant: Secondary | ICD-10-CM

## 2014-01-10 ENCOUNTER — Encounter: Payer: Self-pay | Admitting: Gynecology

## 2014-01-10 ENCOUNTER — Ambulatory Visit (INDEPENDENT_AMBULATORY_CARE_PROVIDER_SITE_OTHER): Payer: BC Managed Care – PPO | Admitting: Gynecology

## 2014-01-10 DIAGNOSIS — Z23 Encounter for immunization: Secondary | ICD-10-CM

## 2014-01-10 DIAGNOSIS — R1013 Epigastric pain: Secondary | ICD-10-CM

## 2014-01-10 NOTE — Patient Instructions (Addendum)
Enfermedad celaca (Celiac Disease) La enfermedad celaca es un trastorno digestivo en el cual el sistema inmunolgico reacciona en contra de sus propias clulas. Interfiere con la absorcin de los nutrientes de los alimentos. La enfermedad celaca tambin se conoce como espre celaco, espre no tropical, y enteropata por sensibilidad al gluten. Las personas que padecen la enfermedad celaca no toleran el gluten. El gluten es una protena que se encuentra en el trigo, el centeno y la Qatarcebada. Con el tiempo, la enfermedad celaca daa las clulas que recubren internamente el intestino delgado. Esto produce American Family Insuranceuna malabsorcin (imposibilidad de absorber los nutrientes de los alimentos), diarrea, y problemas nutricionales.  CAUSAS La enfermedad celaca es gentica. Tendr Neomia Dearuna mayor probabilidad de padecer la enfermedad si alguien en su familia la ha tenido. Hasta el 10% de sus parientes cercanos (padres, hermanos, hijos) tambin pueden tener la enfermedad.  Las personas con enfermedad Interior and spatial designercelaca tienden a Warehouse managertener otras enfermedades autoinmunes, que pueden ser tambin genticas, entre las que se incluyen: Dermatitis herpetiforme. Enfermedad de la tiroides. Lupus eritematoso sistmico. Diabetes tipo 1. Enfermedad heptica. Enfermedad vascular del colgeno. Artritis reumatoidea. Sndrome de [place]Sjgren. SNTOMAS Los sntomas de la enfermedad celaca pueden variar de Neomia Dearuna persona a otra. Los sntomas son generalmente digestivos o nutricionales. Los sntomas digestivos inclyen: Distensin abdominal recurrente y Engineer, miningdolor. Gases. Diarrea a largo plazo (crnica). Materia fecal plida, con olor ftido, y Equatorial Guineagrasosa. Los sntomas nutricionales incluyen: Retraso en el desarrollo en nios. Retraso en el crecimiento en nios. Prdida de peso en nios y Alta Sierraadultos. Ausencia de perodos menstruales (a menudo debido a una prdida importante de Powellvillepeso). Anemia. Debilitamiento de los huesos (osteoporosis). Cansancio y  debilidad. Hormigueo y otros signos de daos en los nervios (neuropata perifrica). Depresin. DIAGNSTICO Si los sntomas y el examen fsico sugieren que existe un trastorno digestivo o Clinical biochemistmalnutricin, el profesional que lo asiste podr State Street Corporationsospechar la presencia de la enfermedad celaca. Es posible que ya haya comenzado una dieta libre de gluten. Si los sntomas persisten, es posible que se deban realizar exmenes para confirmar el diagnstico. Algunas de estas pruebas producen mejores resultados cuando se lleva una dieta normal y sin restricciones. Las pruebas pueden incluir: Exmenes de sangre para verificar carencias nutricionales. Exmenes de sangre para buscar evidencia de que el cuerpo est produciendo anticuerpos contra las clulas del intestino delgado. Tomar una muestra de tejido (biopsia) del intestino delgado para su evaluacin. Radiografas del intestino delgado. Examinar la presencia de grasa en la materia fecal. Exmenes para verificar la absorcin de nutrientes del intestino. TRATAMIENTO Es Sports administratorimportante buscar tratamiento. De lo contrario, la enfermedad celaca puede causar problemas de crecimiento (en nios), anemia, osteoporosis, y posibles problemas neurolgicos. Una paciente embarazada con enfermedad celaca que no es tratada tiene mayor riesgo de perder Firefighterel embarazo, y el feto tiene mayor riesgo de tener bajo peso al nacer y otros problemas de crecimiento. Si la enfermedad celaca se diagnostica en las etapas tempranas, el tratamiento le permitir vivir una vida larga y casi sin sntomas.  El tratamiento incluye seguir una dieta libre de gluten. Esto significa evitar todos los alimentos que contengan gluten. Incluso el consumo de una pequea cantidad de gluten puede daar su intestino. En la Franklin Resourcesmayora de las personas, al seguir esta dieta se detendrn los sntomas. Curar el dao intestinal existente y prevendr L-3 Communicationsmayores daos. Las mejoras comienzan luego de Time Warneralgunos das de Location managercomenzar la  dieta. El intestino delgado normalmente cura completamente luego de 3 a 6 meses, o Physiological scientistpuede llevar hasta 2 aos en los adultos New Cambriamayores.  Un pequeo porcentaje de personas no mejora con la dieta libre de gluten. Segn su edad y el estadio en el que se le diagnostic el trastorno, algunos problemas, como el retraso en el crecimiento y el descoloramiento de los dientes, pueden no Pixleymejorar. En ocasiones los intestinos daados no se curan. Si sus intestinos no estn absorbiendo la cantidad suficiente de nutrientes, es posible que deba recibir suplementos nutritivos a travs de una va intravenosa. Se estn probando tratamientos con medicamentos para pacientes que no responden a la terapia. En este caso, es posible que deba ser evaluado para buscar complicaciones de la enfermedad. El profesional podr recomendar: Neomia DearUna vacuna contra la neumona. Nutrientes y otros tratamientos para deficiencias nutricionales. El profesional que lo asiste puede darle ms informacin acerca de la dieta libre de gluten. Ser de importancia que converse con un nutricionista experto en esta enfermedad. Los grupos de apoyo pueden ser de Nittanyayuda. INSTRUCCIONES PARA EL CUIDADO DOMICILIARIO El cuidado domiciliario se basa en una dieta libre de gluten. Esta dieta debe convertirse en una forma de vida. Deber controlar su respuesta a la dieta libre de gluten y tratar cualquier deficiencia nutricional. Comer fuera del hogar ser un desafo y requerir preparacin. Es importante que realice consultas de seguimiento con el profesional que lo asiste de Whitewatermanera regular. Sugiera a los Graybar Electricmiembros de su familia que sean evaluados para encontrar signos tempranos de la enfermedad. SOLICITE ATENCIN MDICA SI: Contina teniendo sntomas digestivos (gases, retorcijones, diarrea) a pesar de llevar la dieta adecuada. Le resulta dificultoso llevar una dieta libre de gluten. Desarrolla un exantema pruriginoso con grupos de pequeas ampollas. Desarrolla  debilidad extrema, problemas de equilibrio, problemas con la Gladstonemenstruacin, o depresin. Document Released: 06/20/2008 Document Revised: 05/27/2011 [facility] Patient Information 2015 [place], [place]. This information is not intended to replace advice given to you by your health care provider. Make sure you discuss any questions you have with your health care provider. lcera pptica  (Peptic Ulcer)  La lcera pptica es una llaga dolorosa en la membrana que recubre el esfago (lcera esofgica), el estmago (lcera gstrica), o la primera parte del intestino delgado (lcera duodenal). La lcera causa erosin en los tejidos profundos.  CAUSAS  Normalmente, el revestimiento del estmago y del intestino delgado se protegen a s mismos del cido con que se digieren los alimentos. El revestimiento protector puede daarse debido a:   Una infeccin causada por una bacteria llamada Helicobacter pylori (H. pylori).  El uso regular de medicamentos anti-inflamatorios no esteroides (AINE), como el ibuprofeno o la aspirina.  El consumo de tabaco. Otros factores de riesgo incluyen ser mayor de 50 aos, el consumo de alcohol en exceso y Wilburt Finlaytener antecedentes familiares de lcera.  SNTOMAS   Dolor quemante o punzante en la zona entre el pecho y el ombligo.  Acidez.  Nuseas y vmitos.  Hinchazn. El dolor empeora con el estmago vaco y por la noche. Si la lcera sangra, puede causar:   Materia fecal de color negro alquitranado.  Vmito de sangre roja brillante.  Vmito de aspecto similar a la borra del caf. DIAGNSTICO  El diagnstico se realiza basndose en la historia clnica y el examen fsico. Para encontrar las causas de la lcera podrn indicarle otros estudios y procedimientos. Encontrar la causa ayudar a Futures traderdeterminar el mejor tratamiento. Los estudios y procedimientos pueden incluir:   Anlisis de sangre, anlisis de materia fecal, o estudios del aliento para Engineer, manufacturingdetectar la bacteria H.  pylori.  Una seriada del tracto gastrointestinal (GI) del esfago, el  estmago y el intestino delgado.  Una endoscopia para examinar el esfago, el estmago y el intestino delgado.  Una biopsia. TRATAMIENTO  El tratamiento incluye:   La eliminacin de la causa de la lcera, como el tabaquismo, los [place] o el alcohol.  Medicamentos para reducir la cantidad de cido en el tracto digestivo.  Antibiticos si la causa de la lcera es la bacteria H. pylori.  Una endoscopia superior para tratar Rolan Lipa sangrante.  Ciruga si el sangrado es grave o si la lcera ha perforado Event organiser en el sistema digestivo. INSTRUCCIONES PARA EL CUIDADO EN EL HOGAR   Evite el tabaco, el alcohol y la cafena. El fumar puede aumentar el cido en el estmago y el tabaquismo continuado no favorecer la curacin de las lceras.  Evite los alimentos y las bebidas que le parece que le causan molestias o que le agravan su lcera.  Tome slo la medicacin que le indic el profesional. No tome sustitutos de venta libre de los medicamentos recetados sin Science writer a su mdico.  Cumpla con las consultas de control y hgase los estudios segn las indicaciones. SOLICITE ATENCIN MDICA SI:   La infeccin no mejora dentro de los [address]  [address] despus de Programmer, systems.  Siente indigestin o [place] continua. SOLICITE ATENCIN MDICA DE INMEDIATO SI:   Siente un dolor repentino y agudo o persistente en el abdomen.  La materia fecal es sanguinolenta o negra, de aspecto alquitranado.  Vomita sangre o el vmito tiene el aspecto similar a la borra del caf.  Si se siente mareado, dbil o que va a desmayarse.  Se siente transpirado o sudoroso. ASEGRESE DE QUE:   Comprende estas instrucciones.  Controlar su enfermedad.  Solicitar ayuda de inmediato si no mejora o si empeora. Document Released: 12/12/2004 Document Revised: 11/27/2011 [facility] Patient Information 2015 [place], [place]. This  information is not intended to replace advice given to you by your health care provider. Make sure you discuss any questions you have with your health care provider. Colelitiasis (Cholelithiasis) La colelitiasis (tambin llamada clculos en la vescula) es una enfermedad en la que se forman piedras en la vescula. La vescula es un rgano que almacena la bilis que se forma en el hgado y que ayuda a Engineer, agricultural. Los clculos comienzan como pequeos cristales y lentamente se transforman en piedras. El dolor en la vescula ocurre cuando se producen espasmos y los clculos obstruyen el conducto. El dolor tambin se produce cuando una piedra sale por el conducto.  FACTORES DE RIESGO  Ser mujer.   Tener embarazos mltiples. Algunas veces los mdicos aconsejan extirpar los clculos biliares antes de futuros embarazos.   Ser obeso.  Dietas que incluyan comidas fritas y grasas.   Ser mayor de 77 aos y el aumento de la edad.   El uso prolongado de medicamentos que contengan hormonas femeninas.   Tener diabetes mellitus.   Prdida rpida de peso.   Historia familiar de clculos (herencia).  SNTOMAS  Nuseas.   Vmitos.  Dolor abdominal.   Piel amarilla (ictericia)   Dolor sbito. Puede persistir desde algunos minutos hasta algunas horas.  Grant Ruts.   Sensibilidad al tacto. En algunos casos, cuando los clculos biliares no se mueven hacia el conducto biliar, las personas no sienten dolor ni presentan sntomas. Estos se denominan clculos "silenciosos".  TRATAMIENTO Los clculos silenciosos no requieren TEFL teacher. En los [employer], podr ser [place]. Las opciones de tratamiento son:  Kandis Ban para extirpar la  vescula. Es el tratamiento ms frecuente.  Medicamentos. No siempre dan resultado y pueden demorar entre 6 y 12 meses o ms en Scientist, water quality.  Tratamiento con ondas de choque (litotricia biliar extracorporal). En este tratamiento,  una mquina de ultrasonido enva ondas de choque a la vescula para destruir los clculos en pequeos fragmentos que luego podrn pasar a los intestinos o ser disueltas con medicamentos. INSTRUCCIONES PARA EL CUIDADO EN EL HOGAR   Slo tome medicamentos de venta libre o recetados para Primary school teacher, Environmental health practitioner o bajar la fiebre, segn las indicaciones de su mdico.   Siga una dieta baja en grasas hasta que su mdico lo vea nuevamente. Las grasas hacen que la vescula se Technical sales engineer, lo que puede Engineer, agricultural.   Concurra a las consultas de control con su mdico segn las indicaciones. Los ataques casi siempre son recurrentes y generalmente habr que someterse a una ciruga como [place].  SOLICITE ATENCIN MDICA DE INMEDIATO SI:   El dolor aumenta y no puede controlarlo con los medicamentos.   Tiene fiebre o sntomas persistentes durante ms de 2 - 3 das.   Tiene fiebre y los sntomas empeoran repentinamente.   Tiene nuseas o vmitos persistentes.  ASEGRESE DE QUE:   Comprende estas instrucciones.  Controlar su afeccin.  Recibir ayuda de inmediato si no mejora o si empeora. Document Released: 12/19/2005 Document Revised: 11/04/2012 [facility] Patient Information 2015 [place], [place]. This information is not intended to replace advice given to you by your health care provider. Make sure you discuss any questions you have with your health care provider. Influenza Virus Vaccine injection (Fluarix) Qu es este medicamento? La VACUNA ANTIGRIPAL ayuda a disminuir el riesgo de contraer la influenza, tambin conocida como la gripe. La vacuna solo ayuda a protegerle contra algunas cepas de influenza. Esta vacuna no ayuda a reducir Nurse, adult de contraer influenza pandmica H1N1. Este medicamento puede ser utilizado para otros usos; si tiene alguna pregunta consulte con su proveedor de atencin mdica o con su farmacutico. MARCAS COMERCIALES DISPONIBLES: Fluarix,  Fluzone Qu le debo informar a mi profesional de la salud antes de tomar este medicamento? Necesita saber si usted presenta alguno de los siguientes problemas o situaciones: -trastorno de sangrado como hemofilia -fiebre o infeccin -sndrome de Guillain-Barre u otros problemas neurolgicos -problemas del sistema inmunolgico -infeccin por el virus de la inmunodeficiencia humana (VIH) o SIDA -niveles bajos de plaquetas en la sangre -esclerosis mltiple -una Automotive engineer o inusual a las vacunas antigripales, a los huevos, protenas de pollo, al ltex, a la gentamicina, a otros medicamentos, alimentos, colorantes o conservantes -si est embarazada o buscando quedar embarazada -si est amamantando a un beb Cmo debo utilizar este medicamento? Esta vacuna se administra mediante inyeccin por va intramuscular. Lo administra un profesional de [employer]. Recibir una copia de informacin escrita sobre la vacuna antes de cada vacuna. Asegrese de leer este folleto cada vez cuidadosamente. Este folleto puede cambiar con frecuencia. Hable con su pediatra para informarse acerca del uso de este medicamento en nios. Puede requerir atencin especial. Sobredosis: Pngase en contacto inmediatamente con un centro toxicolgico o una sala de urgencia si usted cree que haya tomado demasiado medicamento. ATENCIN: Reynolds American es solo para usted. No comparta este medicamento con nadie. Qu sucede si me olvido de una dosis? No se aplica en este caso. Qu puede interactuar con este medicamento? -quimioterapia o radioterapia -medicamentos que suprimen el sistema inmunolgico, tales como etanercept, anakinra, infliximab y adalimumab -medicamentos que tratan o  previenen cogulos sanguneos, como warfarina -fenitona -medicamentos esteroideos, como la prednisona o la cortisona -teofilina -vacunas Puede ser que esta lista no menciona todas las posibles interacciones. Informe a su profesional de DIRECTV de [employer] Inc productos a base de hierbas, medicamentos de [place] o suplementos nutritivos que est tomando. Si usted fuma, consume bebidas alcohlicas o si utiliza drogas ilegales, indqueselo tambin a su profesional de [employer]. Algunas sustancias pueden interactuar con su medicamento. A qu debo estar atento al usar PPL Corporation? Informe a su mdico o a Producer, television/film/video de la [employer] todos los efectos secundarios que persistan despus de [address]. Llame a su proveedor de atencin mdica si se presentan sntomas inusuales dentro de las 6 semanas posteriores a la vacunacin. Es posible que todava pueda contraer la gripe, pero la enfermedad no ser tan fuerte como normalmente. No puede contraer la gripe de esta vacuna. La vacuna antigripal no le protege contra resfros u otras enfermedades que pueden causar [place]. Debe vacunarse cada ao. Qu efectos secundarios puedo tener al [employer] Scientific este medicamento? Efectos secundarios que debe informar a su mdico o a Producer, television/film/video de la salud tan pronto como sea posible: -reacciones alrgicas como erupcin cutnea, picazn o urticarias, hinchazn de la cara, labios o lengua Efectos secundarios que, por lo general, no requieren atencin mdica (debe informarlos a su mdico o a su profesional de la salud si persisten o si son molestos): -fiebre -dolor de cabeza -molestias y dolores musculares -dolor, sensibilidad, enrojecimiento o Paramedic de la inyeccin -cansancio o debilidad Puede ser que esta lista no menciona todos los posibles efectos secundarios. Comunquese a su mdico por asesoramiento mdico [employer]lett[employer]. Usted puede informar los efectos secundarios a la FDA por telfono al 1-800-FDA-1088. Dnde debo guardar mi medicina? Esta vacuna se administra solamente en clnicas, farmacias, consultorio mdico u otro consultorio de un profesional de la salud y no Teacher, early years/pre en su domicilio. ATENCIN: Este  folleto es un resumen. Puede ser que no cubra toda la posible informacin. Si usted tiene preguntas acerca de esta medicina, consulte con su mdico, su farmacutico o su profesional de Radiographer, therapeutic.  2015, Elsevier/Gold Standard. (2009-09-05 15:31:40)

## 2014-01-10 NOTE — Addendum Note (Signed)
Addended by: Berna SpareASTILLO, Peighton Mehra A on: 01/10/2014 10:16 AM   Modules accepted: Orders

## 2014-01-10 NOTE — Progress Notes (Addendum)
   Patient is a 43 year old who presented to the office today complaining of worsening over the past few months of mid epigastric pain regardless of the food she eats although when they're spicy it exacerbates her discomfort even more. She denies any change in her stool color or pattern. She occasionally feels bloated after meals. She has had a previous hyste[place] in [place] in [place] for dysmenorrhea menorrhagia and fibroid uterus. She had a normal gynecological exam on August 18 in the office. Patient denies any family history of any GI malignancy.  On exam she had no back tenderness. Her abdomen was soft nontender no rebound or guarding she was tender midepigastric on deep palpation. But no rebound or guarding. On 11/02/2013 patient had a normal comprehensive metabolic panel, CBC, urinalysis, screening cholesterol and TSH and Pap smear.  Assessment/plan: #1 patient's midepigastric pain may be peptic ulcer disease versus cholelithiasis. There is a possibility of celiac sprue disease although I could not get a clear history on her diet even when asked in her native tongue. I'm going to check a comprehensive metabolic panel today along with a celiac sprue panel. We are going to obtain an upper abdominal ultrasound and then referred to my GI colleagues for further evaluation. Patient received the flu vaccine today. Literature information in Spanish was provided all the above. Patient was provided with fecal Hemoccult cards to submit to the office for testing.

## 2014-01-11 ENCOUNTER — Telehealth: Payer: Self-pay | Admitting: *Deleted

## 2014-01-11 DIAGNOSIS — R1013 Epigastric pain: Secondary | ICD-10-CM

## 2014-01-11 NOTE — Telephone Encounter (Signed)
Message copied by Aura CampsWEBB, JENNIFER L on Tue Jan 11, 2014  8:41 AM ------      Message from: Ok EdwardsFERNANDEZ, JUAN H      Created: Mon Jan 10, 2014  9:27 AM       Victorino DikeJennifer please schedule upper abdominal ultrasound on this patient with epigastric pains. GI appointment after ultrasound. Thanks ------

## 2014-01-11 NOTE — Telephone Encounter (Signed)
Appointment for ultrasound on 01/14/14 @ 7:30 am pt must be npo after midnight, GI will call to schedule appointment with her. Christie CrapeClaudia will inform patient.

## 2014-01-14 ENCOUNTER — Ambulatory Visit (HOSPITAL_COMMUNITY)
Admission: RE | Admit: 2014-01-14 | Discharge: 2014-01-14 | Disposition: A | Discharge status: Home / Self Care | Payer: BC Managed Care – PPO | Source: Ambulatory Visit | Attending: Gynecology | Admitting: Gynecology

## 2014-01-14 DIAGNOSIS — R1013 Epigastric pain: Secondary | ICD-10-CM | POA: Diagnosis not present

## 2014-01-17 ENCOUNTER — Encounter: Payer: Self-pay | Admitting: Gastroenterology

## 2014-01-17 ENCOUNTER — Encounter: Payer: Self-pay | Admitting: Gynecology

## 2014-01-17 NOTE — Telephone Encounter (Signed)
Appointment on 01/24/14 @ 9:00am  With [name] will inform patient

## 2014-01-24 ENCOUNTER — Other Ambulatory Visit (INDEPENDENT_AMBULATORY_CARE_PROVIDER_SITE_OTHER): Payer: BC Managed Care – PPO

## 2014-01-24 ENCOUNTER — Ambulatory Visit (INDEPENDENT_AMBULATORY_CARE_PROVIDER_SITE_OTHER): Payer: BC Managed Care – PPO | Admitting: Gastroenterology

## 2014-01-24 ENCOUNTER — Encounter: Payer: Self-pay | Admitting: Gastroenterology

## 2014-01-24 ENCOUNTER — Other Ambulatory Visit: Payer: Self-pay | Admitting: *Deleted

## 2014-01-24 VITALS — BP 118/70 | HR 72 | Ht 62.75 in | Wt 150.0 lb

## 2014-01-24 DIAGNOSIS — R1013 Epigastric pain: Secondary | ICD-10-CM

## 2014-01-24 DIAGNOSIS — K219 Gastro-esophageal reflux disease without esophagitis: Secondary | ICD-10-CM

## 2014-01-24 LAB — LIPASE: Lipase: 27 U/L (ref 11.0–59.0)

## 2014-01-24 LAB — H. PYLORI ANTIBODY, IGG: H Pylori IgG: POSITIVE — AB

## 2014-01-24 MED ORDER — OMEPRAZOLE 40 MG PO CPDR: 40.0000 mg | DELAYED_RELEASE_CAPSULE | Freq: Every day | ORAL | Status: DC

## 2014-01-24 NOTE — Progress Notes (Signed)
01/24/2014 Christie Stone [id number] 06/02/1970   HISTORY OF PRESENT ILLNESS:  This is a pleasant 43 year old female who is new to our practice and was referred here by her OB/GYN, Dr. Lily PeerFernandez, for complaints of epigastric abdominal pain dn reflux.  She has limited PMH.  She is here today with an interpreter.  She reports epigastric/upper abdominal pain for several months.  It is present to some degree daily but sometimes it gets worse with certain foods, etc.  Says that the last time it was much worse was after she took some Excedrin.  When the pain worsens it last like that for several days before subsiding.  She also complains of some heartburn/reflux.  She does not take anything for her symptoms regularly but did take an omeprazole for just one day a week or so ago.  She denies any nausea or vomiting.  Maybe 5 pound weight loss over past several months.  Abdominal ultrasound just showed some increased hepatic echogenicity likely indicating steatosis.  Recent CBC, CMP, and TSH were WNL's.    Past Medical History  Diagnosis Date  . GERD (gastroesophageal reflux disease)   . Headache   . Anemia    Past Surgical History  Procedure Laterality Date  . Abdominal hysterectomy  Pt states 10 years ago.  . Cesarean section    . Hemorrhoidectomy      reports that she has never smoked. She has never used smokeless tobacco. She reports that she does not drink alcohol or use illicit drugs. family history includes Hypertension in her father and mother. There is no history of Colon cancer. No Known Allergies    Outpatient Encounter Prescriptions as of 01/24/2014  Medication Sig  . calcium carbonate (OS-CAL) 600 MG TABS tablet Take 600 mg by mouth 2 (two) times daily with a meal.  . Cyanocobalamin (VITAMIN B-12 PO) Take by mouth.  . Omega-3 Fatty Acids (OMEGA 3 PO) Take by mouth.  Marland Kitchen. omeprazole (PRILOSEC) 40 MG capsule Take 1 capsule (40 mg total) by mouth daily.  . [DISCONTINUED] azithromycin  (AZASITE) 1 % ophthalmic solution Apply one drop to [facility] E[facility] BID FOR TWO DAYS THEN Q DAILY FOR 5 DAYS     REVIEW OF SYSTEMS  : All other systems reviewed and negative except where noted in the History of Present Illness.   PHYSICAL EXAM: BP 118/70 mmHg  Pulse 72  Ht 5' 2.75" (1.594 m)  Wt 150 lb (68.04 kg)  BMI 26.78 kg/m2 General: Well developed female in no acute distress Head: Normocephalic and atraumatic Eyes:  Sclerae anicteric, conjunctiva pink. Ears: Normal auditory acuity Lungs: Clear throughout to auscultation Heart: Regular rate and rhythm Abdomen: Soft, non-distended.  Normal bowel sounds.  Mild epigastric/LUQ/mid-abdominal TTP without R/R/G. Musculoskeletal: Symmetrical with no gross deformities  Skin: No lesions on visible extremities Extremities: No edema  Neurological: Alert oriented x 4, grossly non-focal Psychological:  Alert and cooperative. Normal mood and affect  ASSESSMENT AND PLAN: -Epigastric/Mid-abdominal/LUQ abdominal pain with heartburn/reflux as well and some Excedrin use:  Possible gastritis vs ulcer disease, etc.  Will start her on omeprazole 40 mg BID.  I have spoke with her about and offered her EGD but she declined and would like to see how she does on the medication first.  Will have her follow-up in 4 weeks.  Will check lipase and Hpylori labs for now as well (other basic labs WNL's recently).  If Hpylori is positive then will treat that as well.  Avoid  NSAID's.

## 2014-01-24 NOTE — Progress Notes (Signed)
Reviewed and agree with management plan.  Arial Galligan T. Saray Capasso, MD FACG 

## 2014-01-24 NOTE — Patient Instructions (Addendum)
Your physician has requested that you go to the basement for the following lab work before leaving today: Lipase H-pylori  We have sent the following medications to your pharmacy for you to pick up at your convenience: Omeprazole 40 mg, please take one capsule by mouth once daily  Best to take 30 minutes before breakfast    Avoid NSAID's examples are below:  Los medicamentos antiinflamatorios no esteroideos (AINEs) son un grupo de medicamentos que se utilizan a menudo para Engineer, materialsaliviar el dolor y la inflamacin. Entre ellos se incluyen ibuprofeno, aspirina y naproxeno.    You will be due for an office visit 02/23/2014. We will send you a reminder in the mail when it gets closer to that time.   ______________________________________________________________________________________________________________________________________________________________________________________________  Clista Bernhardteflujo gastroesofgico - Adultos  (Gastroesophageal Reflux Disease, Adult)  El reflujo gastroesofgico ocurre cuando el cido del estmago pasa al esfago. Cuando el cido entra en contacto con el esfago, el cido provoca dolor (inflamacin) en el esfago. Con el tiempo, pueden formarse pequeos agujeros (lceras) en el revestimiento del esfago. CAUSAS   Exceso de Runner, broadcasting/film/videopeso corporal. Esto aplica presin Eli Lilly and Companysobre el estmago, lo que hace que el cido del estmago suba hacia el esfago.  El hbito de fumar Aumenta la produccin de cido en el Clioestmago.  El consumo de alcohol. Provoca disminucin de la presin en el esfnter esofgico inferior (vlvula o anillo de msculo entre el esfago y Investment banker, corporateel estmago), permitiendo que el cido del estmago suba hacia el esfago.  Cenas a ltima hora del da y estmago lleno. Aumenta la presin y la produccin de cido en el estmago.  Malformacin en el esfnter esofgico inferior. A menudo no se halla causa.  SNTOMAS   Ardor y Radiographer, therapeuticdolor en la parte inferior del pecho detrs del  esternn y en la zona media del [place]-Varinaestmago. Puede ocurrir Toys 'R' Usdos veces por semana o ms a menudo.  Dificultad para tragar.  Dolor de Advertising copywritergarganta.  Tos seca.  Sntomas similares al asma que incluyen sensacin de opresin en el pecho, falta de aire y sibilancias. DIAGNSTICO  El mdico diagnosticar el problema basndose en los sntomas. En algunos casos, se indican radiografas y otras pruebas para verificar si hay complicaciones o para comprobar el estado del [address]est[address] y Training and development officerel esfago.  TRATAMIENTO  El mdico le indicar medicamentos de venta libre o recetados para ayudar a disminuir la produccin de cido. Consulte con su mdico antes de Corporate investment bankerempezar o agregar cualquier medicamento nuevo.  INSTRUCCIONES PARA EL CUIDADO EN EL HOGAR   Modifique los factores que pueda cambiar. Consulte con su mdico para solicitar orientacin relacionada con la prdida de peso, dejar de fumar y el consumo de alcohol.  Evite las comidas y bebidas que [address] Avenueempeoran los Mansfieldproblemas, [place]:  MinnesotaBebidas con cafena o alcohlicas.  Chocolate.  Sabores a Advertising account plannermenta.  Ajo y cebolla.  Comidas muy condimentadas.  Ctricos como naranjas, limones o limas.  Alimentos que contengan tomate, como salsas, [place]ub[place]ile y pizza.  Alimentos fritos y Lexicographergrasos.  Evite acostarse durante 3 horas antes de irse a dormir o antes de tomar una siesta.  Haga comidas pequeas durante Glass blower/designerel da en lugar de 3 comidas abundantes.  Use ropas sueltas. No use nada apretado alrededor de la cintura que cause presin en el estmago.  Levante (eleve) la cabecera de la cama 6 a 8 pulgadas (15 a 20 cm) con bloques de madera. Usar almohadas extra no ayuda.  Solo tome medicamentos que se pueden comprar sin receta o recetados para Chief Technology Officerel dolor, Dentistmalestar o  fiebre, como le indica el mdico.  No tome aspirina, ibuprofeno ni antiinflamatorios no esteroides. SOLICITE ATENCIN MDICA DE Engelhard CorporationNMEDIATO SI:   Goldman SachsSiente dolor en los brazos, el cuello, la Welbymandbula, los dientes o la espalda.  El  dolor aumenta o cambia la intensidad o la durancin.  Tiene nuseas, vmitos o sudoracin(diaforesis).  Siente falta de aire o dolor en el pecho, o se desmaya.  Vomita y el vmito tiene Decatursangre, es de color [place], Evansdaleamarillo, negro o es similar a la borra del caf o tiene Crawfordsvillesangre.  Las heces son rojas, sanguinolentas o negras. Estos sntomas pueden ser signos de [address] - [address]ros problemas, como enfermedades cardacas, hemorragias gstrias o sangrado esofgico.  ASEGRESE DE QUE:   Comprende estas instrucciones.  Controlar su enfermedad.  Solicitar ayuda de inmediato si no mejora o si empeora. Document Released: 12/12/2004 Document Revised: 05/27/2011 [facility] LLCExitCare Patient Information 2015 [place]reshamExitCare, MarylandLLC. This information is not intended to replace advice given to you by your health care provider. Make sure you discuss any questions you have with your health care provider.

## 2014-01-25 ENCOUNTER — Other Ambulatory Visit: Payer: Self-pay | Admitting: *Deleted

## 2014-01-25 MED ORDER — TETRACYCLINE HCL 500 MG PO CAPS: ORAL_CAPSULE | ORAL | Status: DC

## 2014-01-25 MED ORDER — METRONIDAZOLE 500 MG PO TABS: ORAL_TABLET | ORAL | Status: DC

## 2014-01-25 MED ORDER — BISMUTH SUBSALICYLATE 262 MG PO TABS: ORAL_TABLET | ORAL | Status: DC

## 2014-06-19 ENCOUNTER — Encounter: Payer: Self-pay | Admitting: Gastroenterology

## 2014-06-21 ENCOUNTER — Other Ambulatory Visit (HOSPITAL_COMMUNITY)
Admission: RE | Admit: 2014-06-21 | Discharge: 2014-06-21 | Disposition: A | Discharge status: Home / Self Care | Payer: BLUE CROSS/BLUE SHIELD | Source: Ambulatory Visit | Attending: Gynecology | Admitting: Gynecology

## 2014-06-21 ENCOUNTER — Encounter: Payer: Self-pay | Admitting: Gynecology

## 2014-06-21 ENCOUNTER — Other Ambulatory Visit (HOSPITAL_COMMUNITY): Admission: RE | Admit: 2014-06-21 | Payer: Self-pay | Source: Ambulatory Visit

## 2014-06-21 ENCOUNTER — Ambulatory Visit (INDEPENDENT_AMBULATORY_CARE_PROVIDER_SITE_OTHER): Payer: BLUE CROSS/BLUE SHIELD | Admitting: Gynecology

## 2014-06-21 VITALS — BP 122/76

## 2014-06-21 DIAGNOSIS — R1013 Epigastric pain: Secondary | ICD-10-CM | POA: Diagnosis not present

## 2014-06-21 DIAGNOSIS — Z8619 Personal history of other infectious and parasitic diseases: Secondary | ICD-10-CM

## 2014-06-21 NOTE — Patient Instructions (Addendum)
Anlisis de anticuerpos a Helicobacter Pylori (Helicobacter Pylori Antibodies Test) Se trata de un anlisis de sangre que busca la presencia de una bacteria llamada Helicobacter Pylori. Esto puede diagnosticarse tambin mediante una prueba de Konawaaliento o un examen microscpico de una parte (biopsia) del intestino delgado. H. pylori es una bacteria que se encuentra en las clulas que recubren el [place]estmago. Es un factor de riesgo para las Educational psychologistlceras en el estmago y en el intestino delgado, inflamacin prolongada del recubrimiento del Continentalestmago, o incluso lceras que pueden ocurrir en el esfago (la va que va desde la boca al Rosholtestmago). Esta bacteria es tambin un factor de riesgo de cncer de estmago. La bacteria que se encuentra en, aproximadamente, el 10% de las personas sanas menores de 30 aos de edad, y la cantidad de bacteria aumenta con la edad. La Harley-Davidsonmayora de las personas que tienen esta bacteria no experimentan sntomas; no obstante, se considera que, cuando esta bacteria ocasiona lceras, se pueden usar antibiticos para eliminar o reducir el problema.  PREPARACIN PARA EL ESTUDIO No se requiere una preparacin especial ni ayunar. HALLAZGOS NORMALES Negativo (no se detect la presencia de la bacteria H-pylori) Los rangos para los resultados normales pueden variar entre diferentes laboratorios y hospitales. Consulte siempre con su mdico despus de Production assistant, radiohacer el estudio para Artistconocer el significado de los McEwensvilleresultados y si los valores se consideran "dentro de los lmites normales". SIGNIFICADO DEL ESTUDIO  El mdico leer los resultados y Heritage managerhablar con usted sobre la importancia y el significado de los Los Veteranos IIresultados, as como las opciones de tratamiento y la necesidad de Education officer, environmentalrealizar pruebas adicionales, si fuera necesario. Es su responsabilidad retirar el resultado del Winchesterestudio. Consulte en el laboratorio cundo y cmo podr Starbucks Corporationobtener los resultados. OBTENCIN DE LOS RESULTADOS DE LAS PRUEBAS Es su responsabilidad  retirar el resultado del Dresdenestudio. Consulte en el laboratorio cundo y cmo podr Starbucks Corporationobtener los resultados. Document Released: 12/23/2012 [facility] CenterExitCare Patient Information 2015 [place]rmyExitCare, MarylandLLC. This information is not intended to replace advice given to you by your health care provider. Make sure you discuss any questions you have with your health care provider.   Omeprazole tablets (OTC) Qu es este medicamento? El OMEPRAZOL previene la produccin de cido en el estmago. Ayuda a tratar los sntomas de la acidez estomacal. San MarineEste medicamento se puede adquirir sin receta. Este producto no debe usarse por perodos prolongados, a menos que su mdico o su profesional de Radiographer, therapeuticla salud. Este medicamento puede ser utilizado para otros usos; si tiene alguna pregunta consulte con su proveedor de atencin mdica o con su farmacutico. MARCAS COMERCIALES DISPONIBLES: Prilosec OTC Qu le debo informar a mi profesional de la salud antes de tomar este medicamento? Necesita saber si usted presenta alguno de los siguientes problemas o situaciones: -heces de color oscuro o con sangre -dolor en el pecho -dificultad para tragar -si ha tenido Merchant navy officeracidez estomacal durante ms de 3 meses -si tiene Merchant navy officeracidez estomacal con Golden West Financialmareos, aturdimiento o sudoracin -enfermedad heptica -dolor estomacal -prdida de peso que no tiene explicacin -vmito con Retail buyersangre -sibilancias -una reaccin alrgica o inusual al omeprazol, a otros medicamentos, alimentos, colorantes o conservantes -si est embarazada o buscando quedar embarazada -si est amamantando a un beb Cmo debo utilizar este medicamento? Tome este medicamento por va oral. Siga las instrucciones de la etiqueta del Syracusemedicamento. Si est tomando este medicamento sin receta, toma una tableta cada da. No usar durante ms de [address]14 das o repetir un curso de tratamiento ms a Saks Incorporatedmenudo que cada 4 meses a menos que  as lo indique su mdico o profesional de [employer]. Tome su dosis a intervalos  regulares cada 24 horas. Trague la tableta entera con un vaso de agua. No triture, rompe ni mstique este medicamento. Este medicamento acta mejor si se lo toma con el estmago vaco 30 minutos antes del desayuno. Si esta usando este medicamento con una receta proporcionada por su mdico o profesional de la salud, siga las instrucciones que recibi. No tome su medicamento con una frecuencia mayor a la indicada. Hable con su pediatra para informarse acerca del uso de este medicamento en nios. Puede requerir atencin especial. Sobredosis: Pngase en contacto inmediatamente con un centro toxicolgico o una sala de urgencia si usted cree que haya tomado demasiado medicamento. ATENCIN: Reynolds American es solo para usted. No comparta este medicamento con nadie. Qu sucede si me olvido de una dosis? Si olvida una dosis, tmela lo antes posible. Si es casi la hora de la prxima dosis, tome slo esa dosis. No tome dosis adicionales o dobles. Qu puede interactuar con este medicamento? No tome esta medicina con ninguno de los siguientes medicamentos: -atazanavir -clopidogrel -nelfinavir Esta medicina tambin puede interactuar con los siguientes medicamentos: -ampicilina -ciertos medicamentos para la ansiedad o para conciliar el sueo -ciertos medicamentos que tratan o previenen cogulos sanguneos, como warfarina -ciclosporina -diazepam -digoxina -disulfiram -sales de hierro -fenitona -medicamentos recetados para infecciones micticas o por levadura, tales como itraconazol, quetoconazol, voriconazol -saquinavir -tacrolimo Puede ser que esta lista no menciona todas las posibles interacciones. Informe a su profesional de Beazer Homes de Ingram Micro Inc productos a base de hierbas, medicamentos de [place] o suplementos nutritivos que est tomando. Si usted fuma, consume bebidas alcohlicas o si utiliza drogas ilegales, indqueselo tambin a su profesional de [employer]. Algunas sustancias pueden  interactuar con su medicamento. A qu debo estar atento al usar PPL Corporation? Puede necesitar varios das de tratamiento para que mejore su acidez estomacal. Consulte a su mdico o a su profesional de la salud si su afeccin no comienza a Scientist, clinical (histocompatibility and immunogenetics) o si empeora. No trate la diarrea con productos de H. [employer]. [employer]inz. Comunquese con su mdico si tiene diarrea que dura ms de 2 das o si es severa y [place]. No se trate usted mismo para la acidez estomacal con este medicamento durante ms de 14 das seguidos. Slo debe automedicarse con este medicamento durante perodos de tratamiento de 2 semanas cada 4 meses. Si sus sntomas vuelven a aparecer poco despus de haber finalizado el tratamiento o dentro de los prximos 4 meses, consulte a su mdico o su profesional de Radiographer, therapeutic. Qu efectos secundarios puedo tener al Boston Scientific este medicamento? Efectos secundarios que debe informar a su mdico o a Producer, television/film/video de la salud tan pronto como sea posible: -Therapist, art como erupcin cutnea, picazn o urticarias, hinchazn de la cara, labios o lengua -dolor de hueso, msculo o articulaciones -problemas respiratorios -dolor u opresin en el pecho -orina de color amarillo oscuro o marrn -diarrea -mareos -pulso cardiaco rpido, irregular -sensacin de desmayos o aturdimiento -fiebre o dolor de garganta -espasmos musculares -palpitaciones -enrojecimiento, formacin de ampollas, descamacin o distensin de la piel, inclusive dentro de la boca -convulsiones -temblores -sangrado o magulladuras inusuales -cansancio o debilidad inusual -color amarillento de los ojos o la piel Efectos secundarios que, por lo general, no requieren atencin mdica (debe informarlos a su mdico o a su profesional de la salud si persisten o si son molestos): -estreimiento -boca seca -dolor de cabeza -heces sueltas -nauseas Puede ser que  esta lista no menciona todos los posibles efectos secundarios. Comunquese a su  mdico por asesoramiento mdico Hewlett-[employer]ard. Usted puede informar los efectos secundarios a la FDA por telfono al 1-800-FDA-1088. Dnde debo guardar mi medicina? Mantngala fuera del alcance de los nios. Gurdela a Sanmina-SCI, entre 20 y 25 grados C (69 y 52 grados F). Protjala de la luz y de la humedad. Deseche los medicamentos que no haya utilizado, despus de la fecha de vencimiento. ATENCIN: Este folleto es un resumen. Puede ser que no cubra toda la posible informacin. Si usted tiene preguntas acerca de esta medicina, consulte con su mdico, su farmacutico o su profesional de Radiographer, therapeutic.  2015, Elsevier/Gold Standard. (2011-01-14 18:20:10)

## 2014-06-21 NOTE — Progress Notes (Signed)
   Patient presented to the office again with complaints once again of midepigastric discomfort, bloating, change in bowel movement habits but no blood in her stool. Patient was last seen the office on 01/10/2014 with her history at that time as follows:  Complaining over the past few months of mid epigastric pain regardless of the food she eats although when they're spicy it exacerbates her discomfort even more. She denies any change in her stool color or pattern. She occasionally feels bloated after meals. She has [place] for dysmenorrhea menorrhagia and fibroid uterus.  She subsequently was sent for an upper abdominal ultrasound which had demonstrated the following: IMPRESSION: Increased hepatic echogenicity likely indicating steatosis without other intra-abdominal abnormality identified. This may occasionally be symptomatic. Otherwise normal upper abdominal ultrasound.  The following labs have been ordered which were normal as well last year: Comprehensive metabolic panel, lipase, screening cholesterol, CBC, and TSH.  On November 2015 as a result of her positive H. Pylori IgG screen she was started on Flagyl 500 mg twice a day, tetracycline 500 mg 4 times a day, and bismuth tablets to 4 times a day for 10 day course.  Exam: Blood pressure 122/76 Gen. appearance: Well-developed well-nourished female in no acute distress today. Abdomen: Slightly tender midepigastric region on deep palpation but no rebound or guarding the remainder abdomen was soft. Pelvic exam not indicated Back: No CVA tenderness  Assessment/plan: Patient with history of H. pylori having exacerbation. We are going to obtain H. pylori antibodies from stool sample today. We'll have patient follow up with her gastroenterologist next week for possible EGD as well as offered last year. Patient now would like to proceed and get this scheduled. She had Prilosec 40 mg at home that she can take  one twice a day until she follows up with her gastroenterologist. All these instructions and findings were discussed with the patient Spanish. We'll send Dr. Russella DarStark a note.

## 2014-06-22 ENCOUNTER — Telehealth: Payer: Self-pay | Admitting: *Deleted

## 2014-06-22 NOTE — Telephone Encounter (Signed)
-----   Message from Ok EdwardsJuan H Fernandez, MD sent at 06/21/2014  1:04 PM EDT ----- Victorino DikeJennifer, please schedule an appointment for this patient for next week with Dr. Claudette HeadMalcolm Stark. She has been seen in their office by either PA last year. I would like Dr. Russella DarStark to see her. I have ordered H. pylori antibodies from her stool this week so that they will have the results next week. Thank you

## 2014-06-22 NOTE — Telephone Encounter (Signed)
Pt has appointment scheduled on 07/01/14 @ 9:45am with [facility], pt saw KielerJessica PA, last time, but she is out of the office. Debarah CrapeClaudia will relay this information to patient.

## 2014-06-23 ENCOUNTER — Other Ambulatory Visit: Payer: Self-pay | Admitting: Gynecology

## 2014-06-23 LAB — H.PYLORI ANTIGEN, STOOL: H. pylori ag, stool: POSITIVE

## 2014-06-23 MED ORDER — METRONIDAZOLE 500 MG PO TABS: ORAL_TABLET | ORAL | Status: DC

## 2014-06-23 MED ORDER — TETRACYCLINE HCL 500 MG PO CAPS: ORAL_CAPSULE | ORAL | Status: DC

## 2014-06-23 MED ORDER — BISMUTH SUBSALICYLATE 262 MG PO TABS: ORAL_TABLET | ORAL | Status: DC

## 2014-06-24 ENCOUNTER — Encounter: Payer: Self-pay | Admitting: Gastroenterology

## 2014-07-01 ENCOUNTER — Ambulatory Visit (INDEPENDENT_AMBULATORY_CARE_PROVIDER_SITE_OTHER): Payer: BLUE CROSS/BLUE SHIELD | Admitting: Physician Assistant

## 2014-07-01 ENCOUNTER — Encounter: Payer: Self-pay | Admitting: Physician Assistant

## 2014-07-01 VITALS — BP 126/74 | HR 76 | Ht 62.75 in | Wt 155.0 lb

## 2014-07-01 DIAGNOSIS — B9681 Helicobacter pylori [H. pylori] as the cause of diseases classified elsewhere: Secondary | ICD-10-CM | POA: Diagnosis not present

## 2014-07-01 DIAGNOSIS — G8929 Other chronic pain: Secondary | ICD-10-CM

## 2014-07-01 DIAGNOSIS — A048 Other specified bacterial intestinal infections: Secondary | ICD-10-CM

## 2014-07-01 DIAGNOSIS — R1013 Epigastric pain: Secondary | ICD-10-CM | POA: Diagnosis not present

## 2014-07-01 NOTE — Progress Notes (Signed)
Reviewed and agree with management plan.  Briellah Baik T. Devoiry Corriher, MD FACG 

## 2014-07-01 NOTE — Patient Instructions (Signed)
You have been scheduled for an endoscopy. Please follow written instructions given to you at your visit today. If you use inhalers (even only as needed), please bring them with you on the day of your procedure. Your physician has requested that you go to www.startemmi.com and enter the access code given to you at your visit today. This web site gives a general overview about your procedure. However, you should still follow specific instructions given to you by our office regarding your preparation for the procedure. Take the medications that were prescribed for your H pylori infection. Increase your prilosec to twice daily while you are taking your antibiotics then decrease to once daily.

## 2014-07-01 NOTE — Progress Notes (Signed)
Patient ID: Christie Stone, female   DOB: February 10, 1971, 44 y.o.   MRN: [id number]     History of Present Illness: Christie Stone is a delightful 44 year old Hispanic female who understands English and can speak small amounts of English but is accompanied by interpreter for completeness sake.  This is Marcos was seen by Dr. Lily Peer in October 2015 with mid epigastric pain that occurred regardless of what food she would eat. He felt very bloated after meals. She was found to be H. pylori positive and was treated with Flagyl, tetracycline, bismuth and a PPI. Her symptoms never seemed to get better and she was evaluated by Tylene Fantasia air, PA-C, on 01/24/2014. She had had an ultrasound that showed increased hepatic echogenicity. CBC, CMP, and TSH were normal. It was felt she had possible gastritis versus ulcer disease. She was started on omeprazole 40 mg twice a day. EGD was offered but the patient declined and preferred to see how she did on the medication. She was advised to follow-up in 4 weeks but never did so. She was sent for an H. pylori IgG which was positive and she was prescribed Pylera patient does not recall ever taking this. In any event she was recently seen by Dr. Ardyth Harps again and sent for an H. pylori stool antigen which was positive and on April 7 tetracycline, Flagyl, and bismuth were called in for the patient's. Apparently there was a misunderstanding and the patient says she was unaware of this and has not started her medication. She continues to have epigastric pain worse on an empty stomach, alleviated for a short period of time with ingestion of food. She has intermittent heartburn but denies dysphagia or diarrhea aphasia. She has had no tarry stools and denies bright red blood per rectum she has had no change in her bowel habits she has one bowel movement each morning which over the past 4 weeks has been soft formed to mushy.    Past Medical History  Diagnosis Date  . GERD  (gastroesophageal reflux disease)   . Headache   . Anemia   . H. pylori infection   . OAB (overactive bladder)     Past Surgical History  Procedure Laterality Date  . Abdominal hysterectomy  Pt states 10 years ago.  . Cesarean section    . Hemorrhoidectomy     Family History  Problem Relation Age of Onset  . Hypertension Mother   . Hypertension Father   . Colon cancer Neg Hx    History  Substance Use Topics  . Smoking status: Never Smoker   . Smokeless tobacco: Never Used  . Alcohol Use: No   Current Outpatient Prescriptions  Medication Sig Dispense Refill  . omeprazole (PRILOSEC) 40 MG capsule Take 1 capsule (40 mg total) by mouth daily. 90 capsule 3   No current facility-administered medications for this visit.   No Known Allergies    Review of Systems: Gen: Denies any fever, chills, sweats, anorexia, fatigue, weakness, malaise, weight loss, and sleep disorder CV: Denies chest pain, angina, palpitations, syncope, orthopnea, PND, peripheral edema, and claudication. Resp: Denies dyspnea at rest, dyspnea with exercise, cough, sputum, wheezing, coughing up blood, and pleurisy. GI: Denies vomiting blood, jaundice, and fecal incontinence.   Denies dysphagia or odynophagia. GU : Denies urinary burning, blood in urine, urinary frequency, urinary hesitancy, nocturnal urination, and urinary incontinence. MS: Denies joint pain, limitation of movement, and swelling, stiffness, low back pain, extremity pain. Denies muscle weakness, cramps, atrophy.  Derm: Denies  rash, itching, dry skin, hives, moles, warts, or unhealing ulcers.  Psych: Denies depression, anxiety, memory loss, suicidal ideation, hallucinations, paranoia, and confusion. Heme: Denies bruising, bleeding, and enlarged lymph nodes. Neuro:  Denies any headaches, dizziness, paresthesia Endo:  Denies any problems with DM, thyroid, adrenal  LAB RESULTS: H. pylori stool antigen 06/21/2014 positive H. pylori IgG 01/24/2014  positive   Studies:  Complete   Status: Final result       PACS Images     Show images for US Abdomen Complete     Study Result     CLINICAL DATA: Epigastric abdominal pain for 2-3 months, not related to eating  EXAM: ULTRASOUND ABDOMEN COMPLETE  COMPARISON: None.  FINDINGS: Gallbladder: No gallstones or wall thickening visualized. No sonographic Murphy sign noted.  Common bile duct: Diameter: 3 mm  Liver: Hepatic increased echogenicity likely indicates underlying steatosis without focal abnormality or intrahepatic ductal dilatation.  IVC: No abnormality visualized.  Pancreas: Pancreatic head appears normal but tail and body obscured by bowel gas.  Spleen: Size and appearance within normal limits.  Right Kidney: Length: 10.7 cm. Echogenicity within normal limits. No mass or hydronephrosis visualized.  Left Kidney: Length: 10.5 cm. Echogenicity within normal limits. No mass or hydronephrosis visualized.  Abdominal aorta: No aneurysm visualized.  Other findings: None.  IMPRESSION: Increased hepatic echogenicity likely indicating steatosis without other intra-abdominal abnormality identified. This may occasionally be symptomatic.   Electronically Signed  By: Christiana PellantGretchen Green M.D.  On: 01/14/2014 08:57       Physical Exam: General: Pleasant, well developed female in no acute distress Head: Normocephalic and atraumatic Eyes:  sclerae anicteric, conjunctiva pink  Ears: Normal auditory acuity Lungs: Clear throughout to auscultation Heart: Regular rate and rhythm Abdomen: Soft, non distended, tender epigastric area. No masses, no hepatomegaly. Normal bowel sounds Musculoskeletal: Symmetrical with no gross deformities  Extremities: No edema  Neurological: Alert oriented x 4, grossly nonfocal Psychological:  Alert and cooperative. Normal mood and affect  Assessment and Recommendations:  44 year old Hispanic female with  epigastric and mid abdominal pain along with heartburn and reflux found to be H. pylori positive. She was treated in the fall but has not cleared the infection. She will pick up the medications that were sent to her pharmacy by Dr. Derrell Lollingamirez that include Flagyl 500 mg twice a day along with tetracycline 500 mg 4 times a day along with bismuth tablets 4 times daily for 10 days. She will also increase her Prilosec to 40 mg twice a day for 2 weeks and then decrease it back to once daily. She will then be scheduled for an EGD to evaluate for gastritis or ulcers.The risks, benefits, and alternatives to endoscopy with possible biopsy and possible dilation were discussed with the patient and they consent to proceed. The procedure will be scheduled with Dr. Russella DarStark. Further recommendations will be made pending the findings of her EGD.       Milbert Bixler, Moise BoringLori P PA-C 07/01/2014,

## 2014-07-12 ENCOUNTER — Encounter: Payer: Self-pay | Admitting: Gastroenterology

## 2014-07-27 ENCOUNTER — Ambulatory Visit (AMBULATORY_SURGERY_CENTER): Payer: BLUE CROSS/BLUE SHIELD | Admitting: Gastroenterology

## 2014-07-27 ENCOUNTER — Encounter: Payer: Self-pay | Admitting: Gastroenterology

## 2014-07-27 ENCOUNTER — Other Ambulatory Visit: Payer: Self-pay | Admitting: Gastroenterology

## 2014-07-27 VITALS — BP 137/99 | HR 59 | Temp 97.9°F | Resp 39 | Ht 62.0 in | Wt 155.0 lb

## 2014-07-27 DIAGNOSIS — Z8619 Personal history of other infectious and parasitic diseases: Secondary | ICD-10-CM | POA: Diagnosis not present

## 2014-07-27 DIAGNOSIS — R1013 Epigastric pain: Secondary | ICD-10-CM | POA: Diagnosis present

## 2014-07-27 DIAGNOSIS — G8929 Other chronic pain: Secondary | ICD-10-CM

## 2014-07-27 MED ORDER — SODIUM CHLORIDE 0.9 % IV SOLN: 500.0000 mL | INTRAVENOUS | Status: DC

## 2014-07-27 NOTE — Progress Notes (Signed)
A/ox3 pleased with MAC, report to Penny RN 

## 2014-07-27 NOTE — Op Note (Signed)
[facility] [address] [place], [id number]   ENDOSCOPY PROCEDURE REPORT  PATIENT: Christie CobiaRamirez, [name]  MR#: [id number] BIRTHDATE: Sep 03, 1970 , 43  yrs. old GENDER: female ENDOSCOPIST: Meryl DareMalcolm T Jasneet Schobert, MD, [facility] REFERRED BY:  Reynaldo MiniumFernandez, Juan PROCEDURE DATE:  07/27/2014 PROCEDURE:  EGD w/ biopsy ASA CLASS:     Class II INDICATIONS:  epigastric pain. MEDICATIONS: Monitored anesthesia care and Propofol 100 mg IV TOPICAL ANESTHETIC: none DESCRIPTION OF PROCEDURE: After the risks benefits and alternatives of the procedure were thoroughly explained, informed consent was obtained.  The LB [id number] endoscope was introduced through the mouth and advanced to the second portion of the duodenum , Without limitations.  The instrument was slowly withdrawn as the mucosa was fully examined.    STOMACH: Mild gastritis was found in the gastric body.  Multiple biopsies were performed.   The stomach otherwise appeared normal.  ESOPHAGUS: The mucosa of the esophagus appeared normal. DUODENUM: The duodenal mucosa showed no abnormalities in the bulb and 2nd part of the duodenum.  Retroflexed views revealed no abnormalities.     The scope was then withdrawn from the patient and the procedure completed.  COMPLICATIONS: There were no immediate complications.  ENDOSCOPIC IMPRESSION: 1.    Mild gastritis in the gastric body; multiple biopsies performed 2.   The EGD otherwise appeared normal  RECOMMENDATIONS: 1.  Await pathology results 2.  Office visit to be seen in 4 weeks   eSigned:  Meryl DareMalcolm T Bladyn Tipps, MD, [facility] 07/27/2014 9:53 AM

## 2014-07-27 NOTE — Progress Notes (Signed)
Called to room to assist during endoscopic procedure.  Patient ID and intended procedure confirmed with present staff. Received instructions for my participation in the procedure from the performing physician.  

## 2014-07-27 NOTE — Patient Instructions (Signed)
YOU HAD AN ENDOSCOPIC PROCEDURE TODAY AT THE [facility]:   Refer to the procedure report that was given to you for any specific questions about what was found during the examination.  If the procedure report does not answer your questions, please call your gastroenterologist to clarify.  If you requested that your care partner not be given the details of your procedure findings, then the procedure report has been included in a sealed envelope for you to review at your convenience later.  YOU SHOULD EXPECT: Some feelings of bloating in the abdomen. Passage of more gas than usual.  Walking can help get rid of the air that was put into your GI tract during the procedure and reduce the bloating. If you had a lower endoscopy (such as a colonoscopy or flexible sigmoidoscopy) you may notice spotting of blood in your stool or on the toilet paper. If you underwent a bowel prep for your procedure, you may not have a normal bowel movement for a few days.  Please Note:  You might notice some irritation and congestion in your nose or some drainage.  This is from the oxygen used during your procedure.  There is no need for concern and it should clear up in a day or so.  SYMPTOMS TO REPORT IMMEDIATELY:     Following upper endoscopy (EGD)  Vomiting of blood or coffee ground material  New chest pain or pain under the shoulder blades  Painful or persistently difficult swallowing  New shortness of breath  Fever of 100F or higher  Black, tarry-looking stools  For urgent or emergent issues, a gastroenterologist can be reached at any hour by calling [phone].   DIET: Your first meal following the procedure should be a small meal and then it is ok to progress to your normal diet. Heavy or fried foods are harder to digest and may make you feel nauseous or bloated.  Likewise, meals heavy in dairy and vegetables can increase bloating.  Drink plenty of fluids but you should avoid alcoholic beverages  for 24 hours.  ACTIVITY:  You should plan to take it easy for the rest of today and you should NOT DRIVE or use heavy machinery until tomorrow (because of the sedation medicines used during the test).    FOLLOW UP: Our staff will call the number listed on your records the next business day following your procedure to check on you and address any questions or concerns that you may have regarding the information given to you following your procedure. If we do not reach you, we will leave a message.  However, if you are feeling well and you are not experiencing any problems, there is no need to return our call.  We will assume that you have returned to your regular daily activities without incident.  If any biopsies were taken you will be contacted by phone or by letter within the next 1-3 weeks.  Please call us at [phone] if you have not heard about the biopsies in 3 weeks.    SIGNATURES/CONFIDENTIALITY: You and/or your care partner have signed paperwork which will be entered into your electronic medical record.  These signatures attest to the fact that that the information above on your After Visit Summary has been reviewed and is understood.  Full responsibility of the confidentiality of this discharge information lies with you and/or your care-partner.   Await biopsy results  Information on gastritis given to you today

## 2014-07-28 ENCOUNTER — Telehealth: Payer: Self-pay

## 2014-07-28 NOTE — Telephone Encounter (Signed)
  Follow up Call-  Call back number 07/27/2014  Post procedure Call Back phone  # husband - [phone]  Permission to leave phone message Yes     Patient questions:  Do you have a fever, pain , or abdominal swelling? No. Pain Score  0 *  Have you tolerated food without any problems? Yes.    Have you been able to return to your normal activities? Yes.    Do you have any questions about your discharge instructions: Diet   No. Medications  No. Follow up visit  No.  Do you have questions or concerns about your Care? No.  Actions: * If pain score is 4 or above: No action needed, pain <4.

## 2014-08-03 ENCOUNTER — Encounter: Payer: Self-pay | Admitting: Gastroenterology

## 2014-09-02 ENCOUNTER — Encounter: Payer: Self-pay | Admitting: Gastroenterology

## 2014-09-02 ENCOUNTER — Ambulatory Visit (INDEPENDENT_AMBULATORY_CARE_PROVIDER_SITE_OTHER): Payer: BLUE CROSS/BLUE SHIELD | Admitting: Gastroenterology

## 2014-09-02 VITALS — BP 140/82 | HR 68 | Ht 62.0 in | Wt 156.2 lb

## 2014-09-02 DIAGNOSIS — R1013 Epigastric pain: Secondary | ICD-10-CM

## 2014-09-02 DIAGNOSIS — K219 Gastro-esophageal reflux disease without esophagitis: Secondary | ICD-10-CM | POA: Diagnosis not present

## 2014-09-02 NOTE — Patient Instructions (Signed)
Stay on Prilosec for another 2 months, then take as needed thereafter.  Follow up as needed.   Opciones de alimentos para pacientes con reflujo gastroesofgico (Food Choices for Gastroesophageal Reflux Disease) Cuando se tiene reflujo gastroesofgico (ERGE), los alimentos que se ingieren y los hbitos de alimentacin son muy importantes. Elegir los alimentos adecuados puede ayudar a Paramedic las molestias ocasionadas por el [place]. QU PAUTAS GENERALES DEBO SEGUIR?  Elija las frutas, los vegetales, los cereales integrales, los productos lcteos, la carne de [place], de pescado y de ave con bajo contenido de grasas.  Limite las grasas, [address] [place], los aderezos para [place], la [place], los frutos secos y Programme researcher, broadcasting/film/video.  Lleve un registro de las comidas para identificar los alimentos que ocasionan sntomas.  Evite los alimentos que le ocasionen reflujo. Pueden ser distintos para cada persona.  Haga comidas pequeas con frecuencia en lugar de tres comidas [employer].  Coma lentamente, en un clima distendido.  Limite el consumo de alimentos fritos.  Cocine los alimentos utilizando mtodos que no sean la fritura.  Evite el consumo alcohol.  Evite beber grandes cantidades de lquidos con las comidas.  Evite agacharse o recostarse hasta despus de 2 o 3horas de haber comido. QU ALIMENTOS NO SE RECOMIENDAN? Los siguientes son algunos alimentos y bebidas que pueden empeorar los sntomas: Veterinary surgeon. Jugo de tomate. Salsa de tomate y espagueti. Ajes. [place] y [place]. Rbano picante. Frutas Naranjas, pomelos y limn (fruta y [place]). Carnes Carnes de [place], de pescado y de ave con gran contenido de grasas. Esto incluye los perros calientes, las [place], el [place], la salchicha, el salame y el tocino. Lcteos [place]he entera y [place]. [employer]. Crema. [place]. Helados. Queso crema.  Bebidas Caf y t negro, con o sin cafena Bebidas gaseosas o  energizantes. Condimentos Salsa picante. Salsa barbacoa.  Dulces/postres Chocolate y cacao. Rosquillas. Menta y mentol. Grasas y [employer] con alto contenido de grasas, incluidas las papas fritas. Otros Vinagre. Especias picantes, como la [employer], la pimienta blanca, la pimienta roja, la pimienta de cayena, el curry en Advance, los clavos de [place], el jengibre y el [place] en polvo. Los artculos mencionados arriba pueden no ser [employer] de las bebidas y los alimentos que se Theatre stage manager. Comunquese con el nutricionista para recibir ms informacin. Document Released: 12/12/2004 Document Revised: 03/09/2013 [facility] Patient Information 2015 [place], [place]. This information is not intended to replace advice given to you by your health care provider. Make sure you discuss any questions you have with your health care provider.   Thank you for choosing me and [facility].  Venita Lick. Pleas Koch., MD., Clementeen Graham

## 2014-09-02 NOTE — Progress Notes (Signed)
    History of Present Illness: This is a 44 year old female returning in follow-up after EGD performed in May. Translator is with her today and provides all translation. Biopsy showed mild chronic gastritis without Helicobacter pylori. Her symptoms have substantially improved on omeprazole 40 mg every morning. She notes occasional symptoms brought on by certain foods which she has tried to avoid.  Current Medications, Allergies, Past Medical History, Past Surgical History, Family History and Social History were reviewed in [employer] record.  Physical Exam: General: Well developed , well nourished, no acute distress Head: Normocephalic and atraumatic Eyes:  sclerae anicteric, EOMI Ears: Normal auditory acuity Mouth: No deformity or lesions Lungs: Clear throughout to auscultation Heart: Regular rate and rhythm; no murmurs, rubs or bruits Abdomen: Soft, non tender and non distended. No masses, hepatosplenomegaly or hernias noted. Normal Bowel sounds Musculoskeletal: Symmetrical with no gross deformities  Pulses:  Normal pulses noted Extremities: No clubbing, cyanosis, edema or deformities noted Neurological: Alert oriented x 4, grossly nonfocal Psychological:  Alert and cooperative. Normal mood and affect  Assessment and Recommendations:  1. Epigastric pain, GERD. Follow all standard antireflux measures and continue omeprazole 40 mg daily for 2 months, then may use when necessary.  2. History of H. Pylori which was treated by Dr. Lily Peer. Biopsies at EGD in May did not show evidence of H. Pylori.

## 2014-10-17 ENCOUNTER — Other Ambulatory Visit: Payer: Self-pay | Admitting: Gynecology

## 2014-10-17 DIAGNOSIS — N6310 Unspecified lump in the right breast, unspecified quadrant: Secondary | ICD-10-CM

## 2014-11-07 ENCOUNTER — Ambulatory Visit (INDEPENDENT_AMBULATORY_CARE_PROVIDER_SITE_OTHER): Payer: BLUE CROSS/BLUE SHIELD | Admitting: Gynecology

## 2014-11-07 ENCOUNTER — Encounter: Payer: Self-pay | Admitting: Gynecology

## 2014-11-07 VITALS — BP 118/74 | Ht 63.0 in | Wt 154.0 lb

## 2014-11-07 DIAGNOSIS — N6009 Solitary cyst of unspecified breast: Secondary | ICD-10-CM | POA: Insufficient documentation

## 2014-11-07 DIAGNOSIS — Z01419 Encounter for gynecological examination (general) (routine) without abnormal findings: Secondary | ICD-10-CM

## 2014-11-07 DIAGNOSIS — G43009 Migraine without aura, not intractable, without status migrainosus: Secondary | ICD-10-CM

## 2014-11-07 DIAGNOSIS — N6001 Solitary cyst of right breast: Secondary | ICD-10-CM | POA: Diagnosis not present

## 2014-11-07 MED ORDER — SUMATRIPTAN SUCCINATE 50 MG PO TABS: ORAL_TABLET | ORAL | Status: DC

## 2014-11-07 NOTE — Patient Instructions (Signed)
Sumatriptan tablets Qu es este medicamento? El SUMATRIPTAN se utiliza para tratar las migraas con o sin aura. Una aura es una sensacin o perturbacin visual particular que advierte sobre el ataque. Este medicamento no se utiliza para prevenir ataques de migraa. Este medicamento puede ser utilizado para otros usos; si tiene alguna pregunta consulte con su proveedor de atencin mdica o con su farmacutico. MARCAS COMERCIALES DISPONIBLES: Imitrex Qu le debo informar a mi profesional de la salud antes de tomar este medicamento? Necesita saber si usted presenta alguno de los siguientes problemas o situaciones: -enfermedad intestinal o colitis -diabetes -antecedentes familiares de enfermedad cardiaca -pulso cardiaco rpido o irregular -enfermedad cardiaca o vascular, angina (dolor en el pecho) o ataque cardiaco previo -alta presin sangunea -alto nivel de colesterol -antecedentes de derrames cerebrales, accidentes isqumicos transitorios (AITs o "mini ataques") o hemorragias intracraneales -enfermedad heptica o renal -sobrepeso -mala circulacin -postmenopusica o extirpacin quirrgica del tero y los ovarios -enfermedad de Raynaud -trastorno de convulsiones -una reaccin alrgica o inusual al sumatriptn, otros medicamentos, alimentos, colorantes o conservantes -si est embarazada o buscando quedar embarazada -si est amamantando a un beb Cmo debo utilizar este medicamento? Tome este medicamento por va oral con un vaso de agua. Siga las instrucciones de la etiqueta del medicamento. Utilice este medicamento al sentir los primeros sntomas de un ataque de migraa. Este medicamento no es para uso cotidiano. Si despus de una dosis su migraa reaparece, puede tomar otra dosis segn las indicaciones dadas. Debe haber un intervalo de por lo menos 2 horas entre una dosis y la siguiente. No utilice ms de 100 mg por dosis. No tome ms de 200 mg en total por cada perodo de 24 hs. Si los  sntomas no mejoran en absoluto despus de la primera dosis, no tome una segunda dosis sin consultar con su mdico o con su profesional de la salud. No tome su medicamento con una frecuencia mayor que la indicada. Hable con su pediatra para informarse acerca del uso de este medicamento en nios. Puede requerir atencin especial. Sobredosis: Pngase en contacto inmediatamente con un centro toxicolgico o una sala de urgencia si usted cree que haya tomado demasiado medicamento. ATENCIN: Este medicamento es solo para usted. No comparta este medicamento con nadie. Qu sucede si me olvido de una dosis? No se aplica en este caso. Este medicamento no es para uso regular. Qu puede interactuar con este medicamento? No tome esta medicina con ninguno de los medicamentos siguientes: -anfetamina o cocana -dihidroergotamina, ergotamina, mesilatos ergoloides, metisergida o medicamentos de tipo ergotnico - no tomarlos durante 24 horas despus de tomar sumatriptn. -altamisa -IMAOs, tales como Carbex, Eldepryl, Marplan, Nardil y Parnate - no tomar sumatriptn hasta 2 semanas despus de interrumpir el tratamiento con IMAOs. -otros medicamentos para migraas, tales como almotriptn, eletriptn, naratriptn, rizatriptn, zolmitriptn - no tomarlos durante 24 horas despus de tomar sumatriptn. -triptfano Esta medicina tambin puede interactuar con los siguientes medicamentos: -litio -medicamentos para la depresin mental, la ansiedad o los estados de nimo -medicamentos para perder peso, tales como dexfenfluramina, dextroanfetamina, fenfluramina o sibutramine -hipericina (hierba de [place]) Puede ser que esta lista no menciona todas las posibles interacciones. Informe a su profesional de la salud de todos los productos a base de hierbas, medicamentos de venta libre o suplementos nutritivos que est tomando. Si usted fuma, consume bebidas alcohlicas o si utiliza drogas ilegales, indqueselo tambin a su  profesional de la salud. Algunas sustancias pueden interactuar con su medicamento. A qu debo estar atento al usar este   medicamento? Slo tome este medicamento cuando tenga migraas. Tmelo al sentir sntomas de advertencia o al comienzo de un ataque de migraa. No debe utilizarlo en forma regular para prevenir migraas. Puede experimentar somnolencia o mareos. No conduzca ni utilice maquinaria ni haga nada que le exija permanecer en estado de alerta hasta que sepa cmo le afecta este medicamento. Para reducir los mareos o los desmayos, no se siente ni se ponga de pie con rapidez, especialmente si es un paciente de edad avanzada. El alcohol puede aumentar su somnolencia, su sensacin de mareo y su enrojecimiento. Evite consumir bebidas alcohlicas. Al fumar, puede aumentar el riesgo de efectos secundarios relacionados con el corazn por el uso de este medicamento. Si toma medicamentos para migraas por 10 das o ms en un mes, las migraas pueden empeorar. Mantenga un diario de das de dolor de cabeza y el uso de medicamento. Consulte a su profesional de la salud si los ataques de migraas ocurren con ms frecuencia. Qu efectos secundarios puedo tener al utilizar este medicamento? Efectos secundarios que debe informar a su mdico o a su profesional de la salud tan pronto como sea posible: -reacciones alrgicas como erupcin cutnea, picazn o urticarias, hinchazn de la cara, labios o lengua -pulso cardiaco rpido, lento o irregular -alucinaciones -aumento o disminucin de la presin sangunea -convulsiones -dolor de estmago y calambres severos, diarrea con sangre -signos y sntomas de un cogulo sanguneo tales como problemas respiratorios; cambios en la visin; dolor en el pecho; dolor de cabeza severo, repentino; dolor, hinchazn, clida en la pierna; dificultad para hablar; entumecimiento o debilidad repentina de la cara, brazo o pierna -hormigueo, dolor o entumecimiento en la cara, las manos  o los pies Efectos secundarios que, por lo general, no requieren atencin mdica (debe informarlos a su mdico o a su profesional de la salud si persisten o si son molestos): -somnolencia -sensacin de calor, rubor o enrojecimiento de la cara -dolor de cabeza -dolor, calambre muscular -nuseas, vmitos -cansancio o debilidad inusual Puede ser que esta lista no menciona todos los posibles efectos secundarios. Comunquese a su mdico por asesoramiento mdico sobre los efectos secundarios. Usted puede informar los efectos secundarios a la FDA por telfono al 1-800-FDA-1088. Dnde debo guardar mi medicina? Mantngala fuera del alcance de los nios. Gurdela a temperatura ambiente, entre 2 y 30 grados C (36 y 86 grados F). Deseche todo el medicamento que no haya utilizado, despus de la fecha de vencimiento. ATENCIN: Este folleto es un resumen. Puede ser que no cubra toda la posible informacin. Si usted tiene preguntas acerca de esta medicina, consulte con su mdico, su farmacutico o su profesional de la salud.  2015, [employer]. (2012-11-24 14:07:38) Cefalea migraosa (Migraine Headache) Una cefalea migraosa es un dolor intenso y punzante en uno o ambos lados de la cabeza. La migraa puede durar desde 30 minutos hasta varias horas. CAUSAS  No siempre se conoce la causa exacta de la cefalea migraosa. Sin embargo, pueden aparecer cuando los nervios del cerebro se irritan y liberan ciertas sustancias qumicas que causan inflamacin. Esto ocasiona dolor. Existen tambin ciertos factores que pueden desencadenar las migraas, como los siguientes:  Alcohol.  Fumar.  Estrs.  La menstruacin  Quesos aejados.  Los alimentos o las bebidas que contienen nitratos, glutamato, aspartamo o tiramina.  Falta de sueo.  Chocolate.  Cafena.  Hambre.  Actividad fsica extenuante.  Fatiga.  Medicamentos que se usan para tratar el dolor en el pecho (nitroglicerina), pldoras  anticonceptivas, estrgeno y algunos medicamentos para   la hipertensin arterial. SIGNOS Y SNTOMAS  Dolor en uno o ambos lados de la cabeza.  Dolor pulsante o punzante.  Dolor intenso que impide realizar las actividades diarias.  Dolor que se agrava por cualquier actividad fsica.  Nuseas, vmitos o ambos.  Mareos.  Dolor con la exposicin a las luces brillantes, a los ruidos fuertes o la actividad.  Sensibilidad general a las luces brillantes, a los ruidos fuertes o a los olores. Antes de sufrir una migraa, puede recibir seales de advertencia (aura). Un aura puede incluir:  Ver las luces intermitentes.  Ver puntos brillantes, halos o lneas en zigzag.  Tener una visin en tnel o visin borrosa.  Sensacin de entumecimiento u hormigueo.  Dificultad para hablar  Debilidad muscular. DIAGNSTICO  La cefalea migraosa se diagnostica en funcin de lo siguiente:  Sntomas.  Examen fsico.  Una TC (tomografa computada) o resonancia magntica de la cabeza. Estas pruebas de diagnstico por imagen no pueden diagnosticar las migraas, pero pueden ayudar a descartar otras causas de las cefaleas. TRATAMIENTO Le prescribirn medicamentos para aliviar el dolor y las nuseas. Tambin podrn administrarse medicamentos para ayudar a prevenir las migraas recurrentes.  INSTRUCCIONES PARA EL CUIDADO EN EL HOGAR  Slo tome medicamentos de venta libre o recetados para calmar el dolor o el malestar, segn las indicaciones de su mdico. No se recomienda usar los opiceos a largo plazo.  Cuando tenga la migraa, acustese en un cuarto oscuro y tranquilo  Lleve un registro diario para averiguar lo que puede provocar las cefaleas migraosas. Por ejemplo, escriba:  Lo que usted come y bebe.  Cunto tiempo duerme.  Algn cambio en su dieta o en los medicamentos.  Limite el consumo de bebidas alcohlicas.  Si fuma, deje de hacerlo.  Duerma entre 7 y 9horas, o segn las  recomendaciones del mdico.  Limite el estrs.  Mantenga las luces tenues si le molestan las luces brillantes y la migraa empeora. SOLICITE ATENCIN MDICA DE INMEDIATO SI:   La migraa se hace cada vez ms intensa.  Tiene fiebre.  Presenta rigidez en el cuello.  Tiene prdida de visin.  Presenta debilidad muscular o prdida del control muscular.  Comienza a perder el equilibrio o tiene problemas para caminar.  Sufre mareos o se desmaya.  Tiene sntomas graves que son diferentes a los primeros sntomas. ASEGRESE DE QUE:   Comprende estas instrucciones.  Controlar su afeccin.  Recibir ayuda de inmediato si no mejora o si empeora. Document Released: 03/04/2005 Document Revised: 12/23/2012 ExitCare Patient Information 2015 [employer]. This information is not intended to replace advice given to you by your health care provider. Make sure you discuss any questions you have with your health care provider.  

## 2014-11-07 NOTE — Progress Notes (Signed)
Christie Stone [birth year] [id number]   History:    44 y.o.  for annual gyn exam with several complaints today. She has had  some postnasal drip and some sinus congestion. Patient denies any fever, chills, nausea, vomiting or any coughing. Patient also complains of occasional headaches and very sensitive to light but very infrequent through the month she states that she suffers from migraine like headaches.Patient stated in 2008 she had a total abdominal hysterectomy in [place] secondary to dysmenorrhea, menorrhagia and fibroid uterus. Patient last year had a mammogram and ultrasound because of a palpable area on the right breast in the radiologist reported as a benign appearing cyst and recommend follow-up mammogram in 1 year which she has scheduled.  Past medical history,surgical history, family history and social history were all reviewed and documented in the EPIC chart.  Gynecologic History No LMP recorded. Patient has had a hysterectomy. Contraception: status post hysterectomy Last Pap: 2015. Results were: normal Last mammogram: 2015. Results were: See above  Obstetric History OB History  Gravida Para Term Preterm AB SAB TAB Ectopic Multiple Living  2 2        2     # Outcome Date GA Lbr Len/2nd Weight Sex Delivery Anes PTL Lv  2 Para           1 Para                ROS: A ROS was performed and pertinent positives and negatives are included in the history.  GENERAL: No fevers or chills. HEENT: No change in vision, no earache, sore throat or sinus congestion. NECK: No pain or stiffness. CARDIOVASCULAR: No chest pain or pressure. No palpitations. PULMONARY: No shortness of breath, cough or wheeze. GASTROINTESTINAL: No abdominal pain, nausea, vomiting or diarrhea, melena or bright red blood per rectum. GENITOURINARY: No urinary frequency, urgency, hesitancy or dysuria. MUSCULOSKELETAL: No joint or muscle pain, no back pain, no recent trauma. DERMATOLOGIC: No rash, no itching, no  lesions. ENDOCRINE: No polyuria, polydipsia, no heat or cold intolerance. No recent change in weight. HEMATOLOGICAL: No anemia or easy bruising or bleeding. NEUROLOGIC: No headache, seizures, numbness, tingling or weakness. PSYCHIATRIC: No depression, no loss of interest in normal activity or change in sleep pattern.     Exam: chaperone present  BP 118/74 mmHg  Ht 5\' 3"  (1.6 m)  Wt 154 lb (69.854 kg)  BMI 27.29 kg/m2  Body mass index is 27.29 kg/(m^2).  General appearance : Well developed well nourished female. No acute distress HEENT: Eyes: no retinal hemorrhage or exudates,  Neck supple, trachea midline, no carotid bruits, no thyroidmegaly Lungs: Clear to auscultation, no rhonchi or wheezes, or rib retractions  Heart: Regular rate and rhythm, no murmurs or gallops Breast:Examined in sitting and supine position were symmetrical in appearance, no palpable masses or tenderness,  no skin retraction, no nipple inversion, no nipple discharge, no skin discoloration, no axillary or supraclavicular lymphadenopathy Abdomen: no palpable masses or tenderness, no rebound or guarding Extremities: no edema or skin discoloration or tenderness  Pelvic:  Bartholin, Urethra, Skene Glands: Within normal limits             Vagina: No gross lesions or discharge  Cervix: Absent  Uterus absent  Adnexa  Without masses or tenderness  Anus and perineum  normal   Rectovaginal  normal sphincter tone without palpated masses or tenderness             Hemoccult not indicated     Assessment/Plan:  43 y.o. female for annual exam was prescribed Imitrex 50 mg to take 1 tablet at the onset of her headache in may repeat in 48 hours but not to exceed 2 tablets in a 24-hour period. She was provided with literature information on the medication as well as on migraine headaches. Patient will return back to the office sometime during the week in a fasting state for the following screening blood work: Comprehensive metabolic  panel, fasting lipid profile, TSH, CBC, and urinalysis. Patient to schedule her mammogram. We discussed importance of monthly self breast examination. She can buy over-the-counter Sudafed to take when necessary for seasonal allergy.   Ok Edwards MD, 4:53 PM 11/07/2014

## 2014-11-11 ENCOUNTER — Other Ambulatory Visit: Payer: BLUE CROSS/BLUE SHIELD

## 2014-11-11 DIAGNOSIS — Z01419 Encounter for gynecological examination (general) (routine) without abnormal findings: Secondary | ICD-10-CM

## 2014-11-11 LAB — CBC WITH DIFFERENTIAL/PLATELET
Basophils Absolute: 0 10*3/uL (ref 0.0–0.1)
Basophils Relative: 0 % (ref 0–1)
EOS ABS: 0.1 10*3/uL (ref 0.0–0.7)
Eosinophils Relative: 2 % (ref 0–5)
HCT: 39.8 % (ref 36.0–46.0)
HEMOGLOBIN: 13.3 g/dL (ref 12.0–15.0)
LYMPHS ABS: 1.8 10*3/uL (ref 0.7–4.0)
Lymphocytes Relative: 46 % (ref 12–46)
MCH: 29.9 pg (ref 26.0–34.0)
MCHC: 33.4 g/dL (ref 30.0–36.0)
MCV: 89.4 fL (ref 78.0–100.0)
MONO ABS: 0.3 10*3/uL (ref 0.1–1.0)
MONOS PCT: 8 % (ref 3–12)
MPV: 10 fL (ref 8.6–12.4)
NEUTROS PCT: 44 % (ref 43–77)
Neutro Abs: 1.7 10*3/uL (ref 1.7–7.7)
Platelets: 222 10*3/uL (ref 150–400)
RBC: 4.45 MIL/uL (ref 3.87–5.11)
RDW: 13.6 % (ref 11.5–15.5)
WBC: 3.9 10*3/uL — ABNORMAL LOW (ref 4.0–10.5)

## 2014-11-11 LAB — LIPID PANEL
CHOL/HDL RATIO: 4.5 ratio (ref <=5.0)
CHOLESTEROL: 166 mg/dL (ref 125–200)
HDL: 37 mg/dL — AB (ref >=46)
LDL Cholesterol: 111 mg/dL (ref <130)
TRIGLYCERIDES: 91 mg/dL (ref <150)
VLDL: 18 mg/dL (ref <30)

## 2014-11-11 LAB — TSH: TSH: 1.404 u[IU]/mL (ref 0.350–4.500)

## 2014-11-11 LAB — URINALYSIS W MICROSCOPIC + REFLEX CULTURE
Bilirubin Urine: NEGATIVE
CASTS: NONE SEEN [LPF]
CRYSTALS: NONE SEEN [HPF]
Glucose, UA: NEGATIVE
HGB URINE DIPSTICK: NEGATIVE
Leukocytes, UA: NEGATIVE
Nitrite: NEGATIVE
PH: 5.5 (ref 5.0–8.0)
Protein, ur: NEGATIVE
RBC / HPF: NONE SEEN RBC/HPF (ref <=2)
Specific Gravity, Urine: 1.024 (ref 1.001–1.035)
Yeast: NONE SEEN [HPF]

## 2014-11-11 LAB — COMPREHENSIVE METABOLIC PANEL
ALBUMIN: 4.3 g/dL (ref 3.6–5.1)
ALT: 19 U/L (ref 6–29)
AST: 20 U/L (ref 10–35)
Alkaline Phosphatase: 45 U/L (ref 33–115)
BUN: 11 mg/dL (ref 7–25)
CHLORIDE: 106 mmol/L (ref 98–110)
CO2: 24 mmol/L (ref 20–31)
Calcium: 8.9 mg/dL (ref 8.6–10.2)
Creat: 0.63 mg/dL (ref 0.50–1.10)
Glucose, Bld: 82 mg/dL (ref 65–99)
POTASSIUM: 3.7 mmol/L (ref 3.5–5.3)
Sodium: 140 mmol/L (ref 135–146)
TOTAL PROTEIN: 6.7 g/dL (ref 6.1–8.1)
Total Bilirubin: 0.7 mg/dL (ref 0.2–1.2)

## 2014-11-13 LAB — URINE CULTURE

## 2014-11-18 ENCOUNTER — Other Ambulatory Visit: Payer: Self-pay

## 2014-11-18 ENCOUNTER — Other Ambulatory Visit: Payer: Self-pay | Admitting: Gynecology

## 2014-11-18 DIAGNOSIS — N6310 Unspecified lump in the right breast, unspecified quadrant: Secondary | ICD-10-CM

## 2014-11-23 ENCOUNTER — Ambulatory Visit
Admission: RE | Admit: 2014-11-23 | Discharge: 2014-11-23 | Disposition: A | Discharge status: Home / Self Care | Payer: BLUE CROSS/BLUE SHIELD | Source: Ambulatory Visit | Attending: Gynecology | Admitting: Gynecology

## 2014-11-23 ENCOUNTER — Other Ambulatory Visit: Payer: Self-pay | Admitting: Gynecology

## 2014-11-23 DIAGNOSIS — Z1231 Encounter for screening mammogram for malignant neoplasm of breast: Secondary | ICD-10-CM

## 2014-11-23 DIAGNOSIS — N6310 Unspecified lump in the right breast, unspecified quadrant: Secondary | ICD-10-CM

## 2015-02-01 ENCOUNTER — Encounter: Payer: Self-pay | Admitting: Women's Health

## 2015-02-01 ENCOUNTER — Ambulatory Visit (INDEPENDENT_AMBULATORY_CARE_PROVIDER_SITE_OTHER): Payer: BLUE CROSS/BLUE SHIELD | Admitting: Women's Health

## 2015-02-01 VITALS — BP 124/80 | Ht 62.0 in | Wt 155.0 lb

## 2015-02-01 DIAGNOSIS — R1032 Left lower quadrant pain: Secondary | ICD-10-CM

## 2015-02-01 DIAGNOSIS — R350 Frequency of micturition: Secondary | ICD-10-CM

## 2015-02-01 DIAGNOSIS — N898 Other specified noninflammatory disorders of vagina: Secondary | ICD-10-CM

## 2015-02-01 LAB — URINALYSIS W MICROSCOPIC + REFLEX CULTURE
Bilirubin Urine: NEGATIVE
CASTS: NONE SEEN [LPF]
CRYSTALS: NONE SEEN [HPF]
Glucose, UA: NEGATIVE
Ketones, ur: NEGATIVE
Leukocytes, UA: NEGATIVE
NITRITE: NEGATIVE
PH: 5.5 (ref 5.0–8.0)
Protein, ur: NEGATIVE
SPECIFIC GRAVITY, URINE: 1.015 (ref 1.001–1.035)
YEAST: NONE SEEN [HPF]

## 2015-02-01 LAB — WET PREP FOR TRICH, YEAST, CLUE
Clue Cells Wet Prep HPF POC: NONE SEEN
Trich, Wet Prep: NONE SEEN
YEAST WET PREP: NONE SEEN

## 2015-02-01 NOTE — Progress Notes (Signed)
Patient ID: Christie Stone, female   DOB: 09/25/1970, 44 y.o.   MRN: [id number] Spanish speaking interpreter Christie Stone Presents with numerous complaints, left-sided abdominal pain that radiates to her lower back, increased pain after eating approximately 3-4 hours, mild urinary burning at initiation of stream, no pain at end of stream. Increased vaginal discharge with odor.  Approximate 3 weeks ago was having some chest pain, no SOB that has resolved. Left lower quadrant discomfort all the time for the past 2 weeks. Mild constipation, no diarrhea, nausea, or vomiting. Diet high in raw vegetables, minimal spicy foods. Denies fever. TAH for menorrhagia. Same partner.  Exam: Appears well. External genitalia within normal limits, speculum exam scant white discharge without odor, wet prep negative. Bimanual no right-sided adnexal tenderness, minimal left-sided adnexal tenderness discomfort higher. No CVAT. UA: Trace blood, 0 -2 RBCs, 40-60 squamous epithelials, many bacteria.  Left lower quadrant discomfort 2 weeks  Plan: Urine culture pending. Reviewed normality of vaginal exam and wet prep. Encouraged to eat a bland diet, if pain persists ultrasound. Reviewed may be more GI related than GYN.

## 2015-02-01 NOTE — Patient Instructions (Signed)
Dolor abdominal en adultos °(Abdominal Pain, Adult) °El dolor puede tener muchas causas. Normalmente la causa del dolor abdominal no es una enfermedad y mejorará sin tratamiento. Frecuentemente puede controlarse y tratarse en casa. Su médico le realizará un examen físico y posiblemente solicite análisis de sangre y radiografías para ayudar a determinar la gravedad de su dolor. Sin embargo, en muchos casos, debe transcurrir más tiempo antes de que se pueda encontrar una causa evidente del dolor. Antes de llegar a ese punto, es posible que su médico no sepa si necesita más pruebas o un tratamiento más profundo. °INSTRUCCIONES PARA EL CUIDADO EN EL HOGAR  °Esté atento al dolor para ver si hay cambios. Las siguientes indicaciones ayudarán a aliviar cualquier molestia que pueda sentir: °· Tome solo medicamentos de venta libre o recetados, según las indicaciones del médico. °· No tome laxantes a menos que se lo haya indicado su médico. °· Pruebe con una dieta líquida absoluta (caldo, té o agua) según se lo indique su médico. Introduzca gradualmente una dieta normal, según su tolerancia. °SOLICITE ATENCIÓN MÉDICA SI: °· Tiene dolor abdominal sin explicación. °· Tiene dolor abdominal relacionado con náuseas o diarrea. °· Tiene dolor cuando orina o defeca. °· Experimenta dolor abdominal que lo despierta de noche. °· Tiene dolor abdominal que empeora o mejora cuando come alimentos. °· Tiene dolor abdominal que empeora cuando come alimentos grasosos. °· Tiene fiebre. °SOLICITE ATENCIÓN MÉDICA DE INMEDIATO SI:  °· El dolor no desaparece en un plazo máximo de 2 horas. °· No deja de (vomitar). °· El dolor se siente solo en partes del abdomen, como el lado derecho o la parte inferior izquierda del abdomen. °· Evacúa materia fecal sanguinolenta o negra, de aspecto alquitranado. °ASEGÚRESE DE QUE: °· Comprende estas instrucciones. °· Controlará su afección. °· Recibirá ayuda de inmediato si no mejora o si empeora. °  °Esta  información no tiene como fin reemplazar el consejo del médico. Asegúrese de hacerle al médico cualquier pregunta que tenga. °  °Document Released: 03/04/2005 Document Revised: 03/25/2014 °Elsevier Interactive Patient Education ©2016 [employer] ° °

## 2015-02-02 LAB — URINE CULTURE

## 2015-04-24 IMAGING — US US ABDOMEN COMPLETE
1 series · 14 of 25 positions shown · non-contrast
Comparison: None.

CLINICAL DATA: Epigastric abdominal pain for 2-3 months, not
related to eating

EXAM:
ULTRASOUND ABDOMEN COMPLETE

[Series 1: us abdomen complete · 0.19mm/px · 14 of 66 slices shown]
[im 1/66]
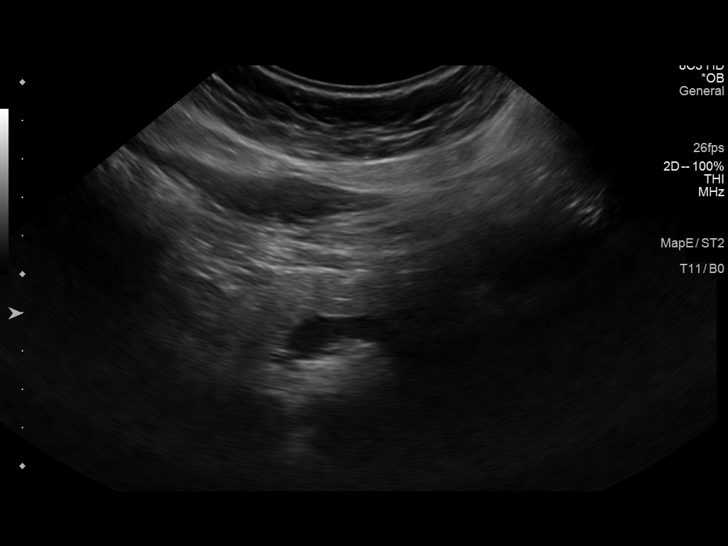
[im 6/66]
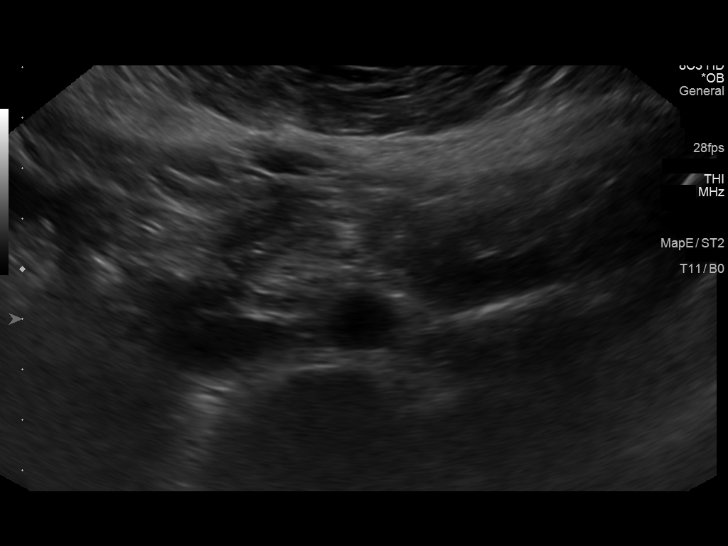
[im 11/66]
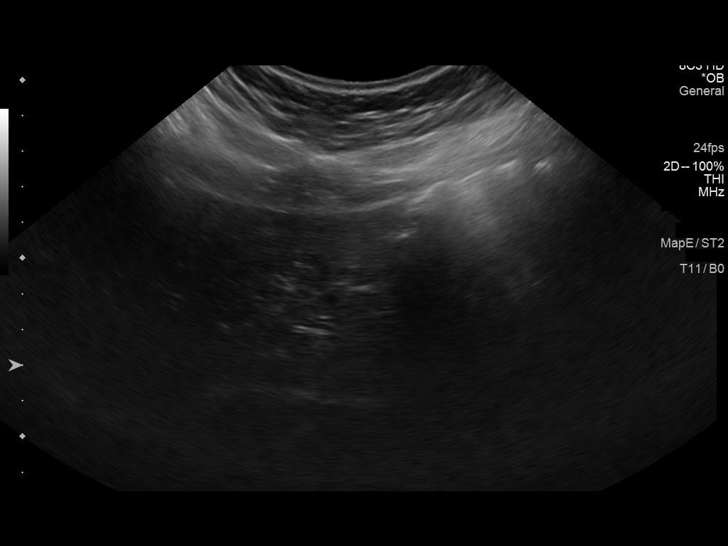
[im 17/66]
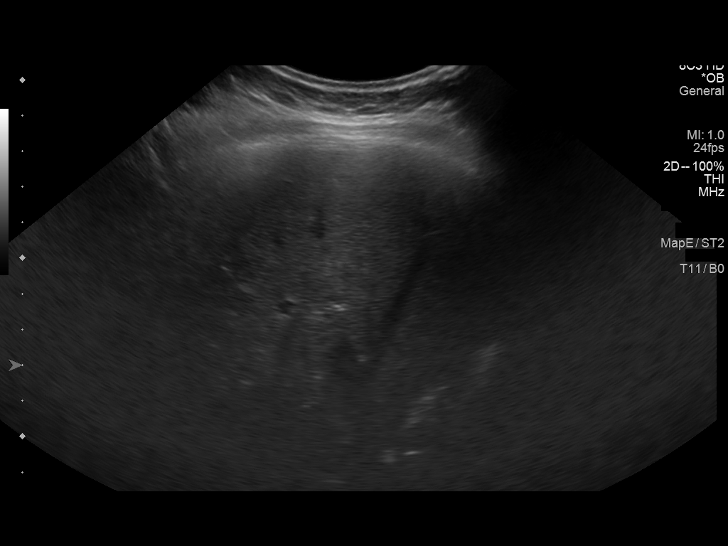
[im 22/66]
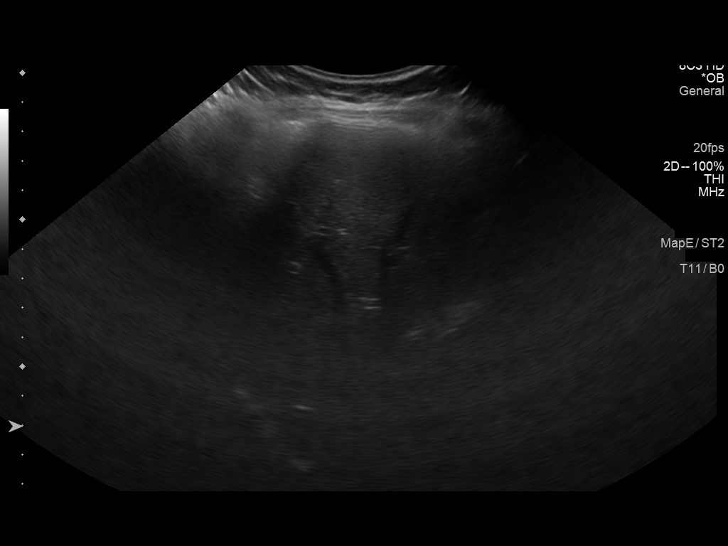
[im 25/66]
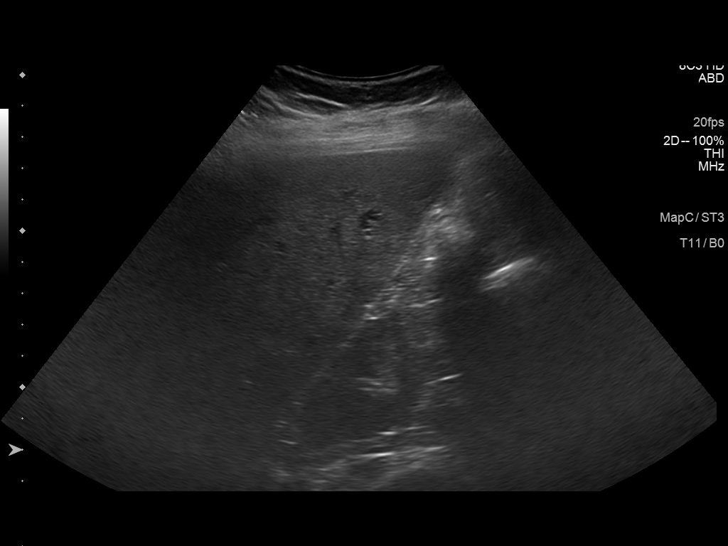
[im 30/66]
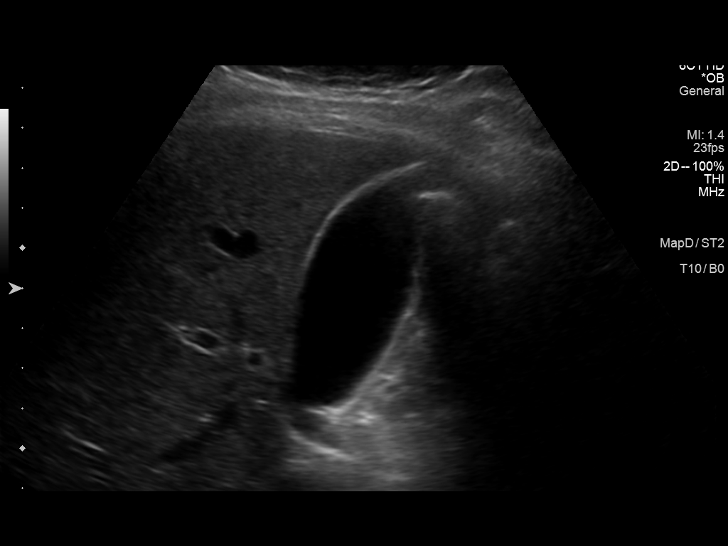
[im 36/66]
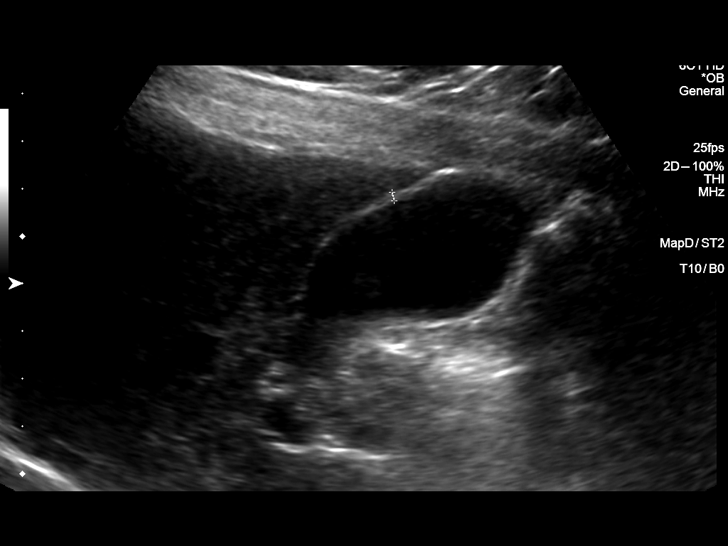
[im 41/66]
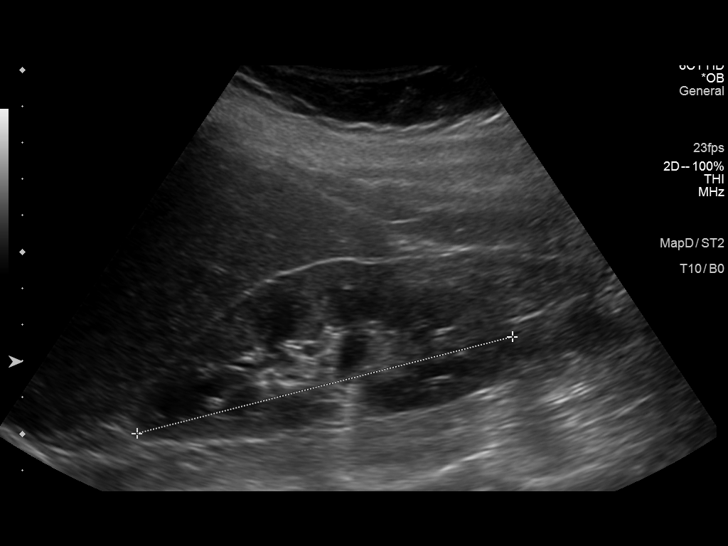
[im 44/66]
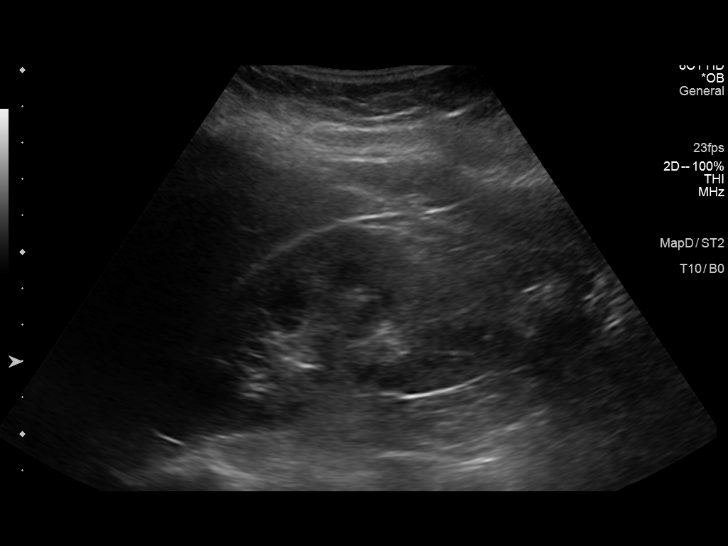
[im 49/66]
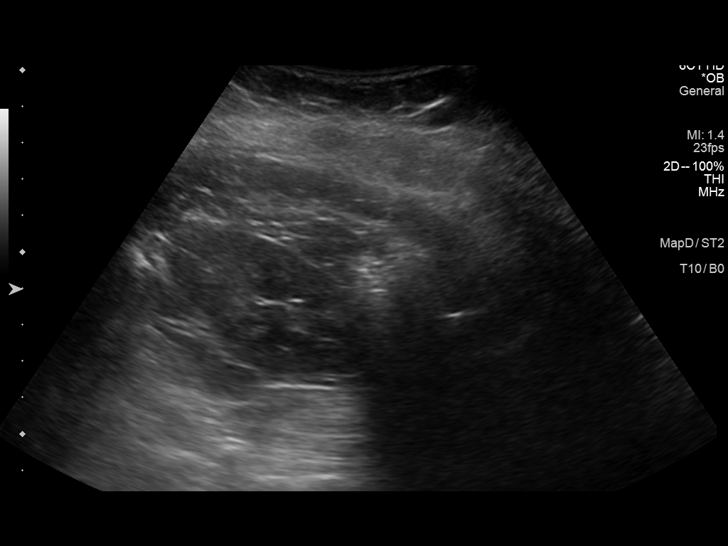
[im 55/66]
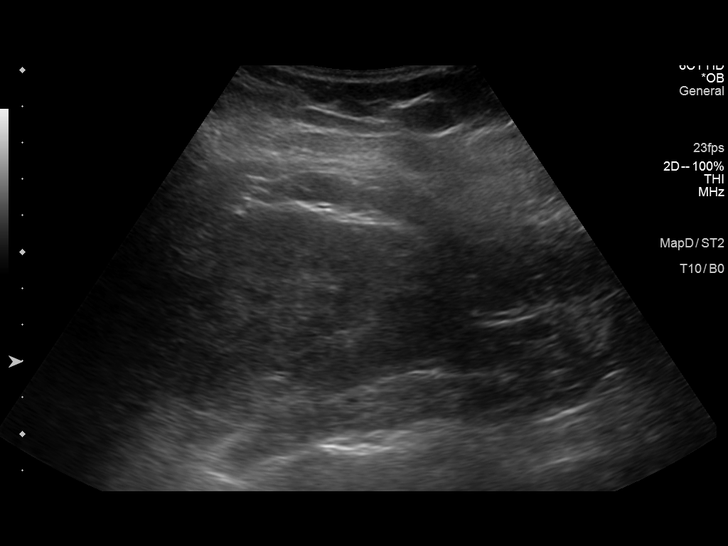
[im 60/66]
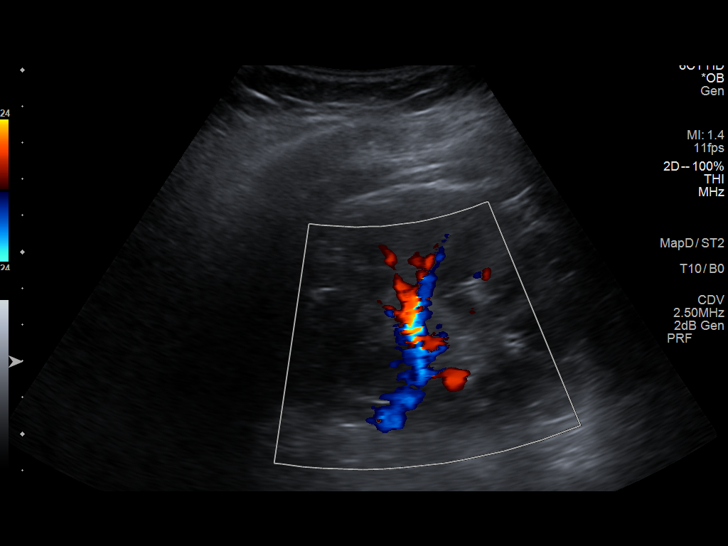
[im 66/66]
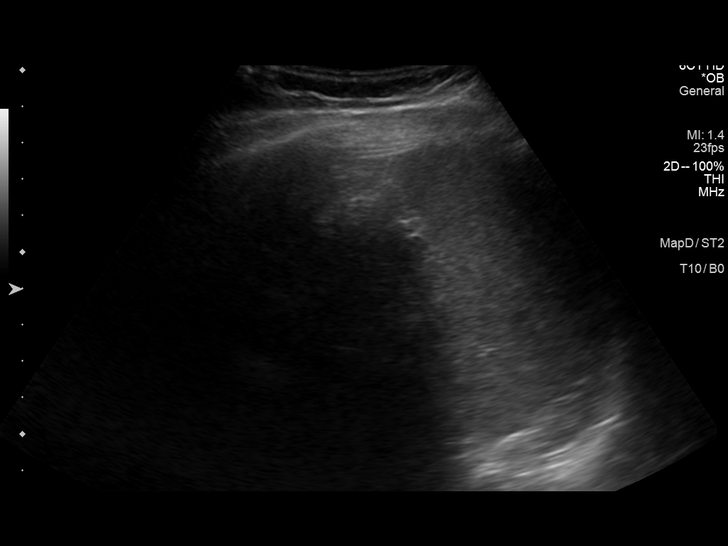

[14 of 25 positions shown; findings below may reference images not displayed]

FINDINGS: Gallbladder: No gallstones or wall thickening visualized. No
sonographic Murphy sign noted.

Common bile duct: Diameter: 3 mm

Liver: Hepatic increased echogenicity likely indicates underlying
steatosis without focal abnormality or intrahepatic ductal
dilatation.

IVC: No abnormality visualized.

Pancreas: Pancreatic head appears normal but tail and body obscured
by bowel gas.

Spleen: Size and appearance within normal limits.

Right Kidney: Length: 10.7 cm. Echogenicity within normal limits. No
mass or hydronephrosis visualized.

Left Kidney: Length: 10.5 cm. Echogenicity within normal limits. No
mass or hydronephrosis visualized.

Abdominal aorta: No aneurysm visualized.

Other findings: None.
IMPRESSION: Increased hepatic echogenicity likely indicating steatosis without
other intra-abdominal abnormality identified. This may occasionally
be symptomatic.

## 2015-08-07 ENCOUNTER — Encounter (HOSPITAL_COMMUNITY): Payer: Self-pay

## 2015-11-16 ENCOUNTER — Ambulatory Visit (INDEPENDENT_AMBULATORY_CARE_PROVIDER_SITE_OTHER): Payer: Self-pay | Admitting: Gynecology

## 2015-11-16 ENCOUNTER — Encounter: Payer: Self-pay | Admitting: Gynecology

## 2015-11-16 VITALS — BP 144/90 | Ht 63.0 in | Wt 154.0 lb

## 2015-11-16 DIAGNOSIS — G5601 Carpal tunnel syndrome, right upper limb: Secondary | ICD-10-CM

## 2015-11-16 DIAGNOSIS — Z01419 Encounter for gynecological examination (general) (routine) without abnormal findings: Secondary | ICD-10-CM

## 2015-11-16 DIAGNOSIS — Z23 Encounter for immunization: Secondary | ICD-10-CM

## 2015-11-16 LAB — LIPID PANEL
CHOL/HDL RATIO: 4.2 ratio (ref <=5.0)
CHOLESTEROL: 179 mg/dL (ref 125–200)
HDL: 43 mg/dL — ABNORMAL LOW (ref >=46)
LDL CALC: 117 mg/dL (ref <130)
TRIGLYCERIDES: 97 mg/dL (ref <150)
VLDL: 19 mg/dL (ref <30)

## 2015-11-16 LAB — URINALYSIS W MICROSCOPIC + REFLEX CULTURE
Bilirubin Urine: NEGATIVE
Casts: NONE SEEN [LPF]
Crystals: NONE SEEN [HPF]
GLUCOSE, UA: NEGATIVE
HGB URINE DIPSTICK: NEGATIVE
Ketones, ur: NEGATIVE
LEUKOCYTES UA: NEGATIVE
Nitrite: NEGATIVE
PH: 7.5 (ref 5.0–8.0)
Protein, ur: NEGATIVE
RBC / HPF: NONE SEEN RBC/HPF (ref <=2)
Specific Gravity, Urine: 1.01 (ref 1.001–1.035)
WBC UA: NONE SEEN WBC/HPF (ref <=5)
Yeast: NONE SEEN [HPF]

## 2015-11-16 LAB — COMPREHENSIVE METABOLIC PANEL
ALT: 17 U/L (ref 6–29)
AST: 19 U/L (ref 10–35)
Albumin: 4.3 g/dL (ref 3.6–5.1)
Alkaline Phosphatase: 56 U/L (ref 33–115)
BUN: 10 mg/dL (ref 7–25)
CHLORIDE: 103 mmol/L (ref 98–110)
CO2: 25 mmol/L (ref 20–31)
CREATININE: 0.69 mg/dL (ref 0.50–1.10)
Calcium: 9.4 mg/dL (ref 8.6–10.2)
Glucose, Bld: 84 mg/dL (ref 65–99)
POTASSIUM: 3.9 mmol/L (ref 3.5–5.3)
SODIUM: 140 mmol/L (ref 135–146)
Total Bilirubin: 0.6 mg/dL (ref 0.2–1.2)
Total Protein: 7.1 g/dL (ref 6.1–8.1)

## 2015-11-16 LAB — CBC WITH DIFFERENTIAL/PLATELET
BASOS ABS: 44 {cells}/uL (ref 0–200)
Basophils Relative: 1 %
EOS ABS: 88 {cells}/uL (ref 15–500)
EOS PCT: 2 %
HCT: 39.4 % (ref 35.0–45.0)
Hemoglobin: 13.2 g/dL (ref 11.7–15.5)
LYMPHS PCT: 35 %
Lymphs Abs: 1540 cells/uL (ref 850–3900)
MCH: 29.5 pg (ref 27.0–33.0)
MCHC: 33.5 g/dL (ref 32.0–36.0)
MCV: 88.1 fL (ref 80.0–100.0)
MONOS PCT: 5 %
MPV: 10.8 fL (ref 7.5–12.5)
Monocytes Absolute: 220 cells/uL (ref 200–950)
NEUTROS ABS: 2508 {cells}/uL (ref 1500–7800)
NEUTROS PCT: 57 %
PLATELETS: 238 10*3/uL (ref 140–400)
RBC: 4.47 MIL/uL (ref 3.80–5.10)
RDW: 13.4 % (ref 11.0–15.0)
WBC: 4.4 10*3/uL (ref 3.8–10.8)

## 2015-11-16 LAB — TSH: TSH: 2.19 mIU/L

## 2015-11-16 MED ORDER — GENTAMICIN SULFATE 0.3 % OP SOLN: 2.0000 [drp] | Freq: Four times a day (QID) | OPHTHALMIC | Status: DC

## 2015-11-16 NOTE — Patient Instructions (Addendum)
Conjuntivitis bacteriana  (Bacterial Conjunctivitis)  La conjuntivitis bacteriana, comnmente llamada ojo rosado, es una inflamacin de la membrana transparente que cubre la parte blanca del ojo (conjuntiva). La inflamacin tambin puede ocurrir en la parte interna de los prpados. Los vasos sanguneos de la conjuntiva se inflaman haciendo que el ojo se ponga de color rojo o rosado. La conjuntivitis bacteriana puede propagarse fcilmente de un ojo a otro y de Ardelia Mems persona a otra (es contagiosa).  CAUSAS  La causa de la conjuntivitis bacteriana es una bacteria. La bacteria puede provenir de la propia piel, del tracto respiratorio superior o de otra persona que padece conjuntivitis bacteriana.  SNTOMAS  La parte del ojo o la zona interna del prpado que normalmente son blancas se ven de color rosado o rojo. El ojo rosado se asocia a Psychologist, prison and probation services, lagrimeo y sensibilidad a Naval architect. La conjuntivitis bacteriana se asocia a una secrecin espesa y amarillenta de los ojos. La secrecin puede convertirse en una costra en el prpado durante la noche, lo que hace que los prpados se peguen. Si tiene secrecin, tambin puede tener visin borrosa en el ojo afectado.  DIAGNSTICO  El diagnstico de conjuntivitis bacteriana lo realiza el mdico con un examen de la vista y por los sntomas que usted refiere. Su mdico observar si hay cambios en los tejidos de la superficie de los ojos, los que pueden indicar el tipo especfico de conjuntivitis. Tomar una muestra de la secrecin con un hisopo de algodn si la conjuntivitis es grave, si la crnea se ve afectada, o si sufre repetidas infecciones que no responden al tratamiento. Luego enva la muestra a un laboratorio para diagnosticar si la causa de la inflamacin es una infeccin bacteriana y para comprobar si responder a los antibiticos.  TRATAMIENTO   La conjuntivitis bacteriana se trata con antibiticos. Generalmente se recetan gotas oftlmicas con antibitico. Tambin  hay ungentos con antibiticos disponibles. En algunos casos se recetan antibiticos por va oral. Las lgrimas artificiales o el lavado del ojo pueden aliviar las [place]. INSTRUCCIONES PARA EL CUIDADO EN EL HOGAR   Para aliviar el malestar, aplique un pao hmedo, limpio y fro en el ojo durante 10 a 20 minutos, 3 a 4 veces por da.  Limpie suavemente las secreciones del ojo con un pao tibio y hmedo o una bolita de algodn.  Lave sus manos frecuentemente con agua y [place]. Use toallas de papel para secarse las manos.  No comparta toallones ni toallas de mano. As podr diseminarse la infeccin.  Cambie o lave la funda de la [employer]  No use maquillaje en los ojos hasta que la infeccin haya desaparecido.  No maneje maquinaria ni conduzca vehculos si su visin es borrosa.  Deje de usar los entes de [place]. Consulte con su mdico si debe esterilizar o reemplazar sus lentes de contacto antes de usarlos de nuevo. Esto depende del tipo de lentes de contacto que use.  Al aplicarse el medicamento en el ojo infectado, no toque el borde del prpado con el frasco de gotas para los ojos o el tubo de [place]. SOLICITE ATENCIN MDICA DE INMEDIATO SI:   La infeccin no mejora dentro de los 3 das despus de iniciar el tratamiento.  Tuvo una secrecin amarillenta en el ojo y vuelve a Arts administrator.  Aumenta el dolor en el ojo.  El enrojecimiento se extiende.  La visin se vuelve borrosa.  Tiene fiebre o sntomas persistentes durante ms de 2  3 das.  Jaclynn Guarneri y  los sntomas empeoran repentinamente.  Siente dolor, enrojecimiento o Licensed conveyancer. ASEGRESE DE QUE:   Comprende estas instrucciones.  Controlar su enfermedad.  Solicitar ayuda de inmediato si no mejora o si empeora.   Esta informacin no tiene Theme park manager el consejo del mdico. Asegrese de hacerle al mdico cualquier pregunta que tenga.   Document Released: 12/12/2004 Document  Revised: 11/27/2011 Elsevier Interactive Patient Education 2016 [employer]. Influenza Virus Vaccine (Flucelvax) Qu es este medicamento? La VACUNA ANTIGRIPAL ayuda a disminuir el riesgo de contraer la influenza, tambin conocida como la gripe. La vacuna solo ayuda a protegerle contra algunas cepas de influenza. Este medicamento puede ser utilizado para otros usos; si tiene alguna pregunta consulte con su proveedor de atencin mdica o con su farmacutico. Qu le debo informar a mi profesional de la salud antes de tomar este medicamento? Necesita saber si usted presenta alguno de los siguientes problemas o situaciones: -trastorno de sangrado como hemofilia -fiebre o infeccin -sndrome de Guillain-Barre u otros problemas neurolgicos -problemas del sistema inmunolgico -infeccin por el virus de la inmunodeficiencia humana (VIH) o SIDA -niveles bajos de plaquetas en la sangre -esclerosis mltiple -una reaccin alrgica o inusual a las vacunas antigripales, a otros medicamentos, alimentos, colorantes o conservantes -si est embarazada o buscando quedar embarazada -si est amamantando a un beb Cmo debo utilizar este medicamento? Esta vacuna se administra mediante inyeccin por va intramuscular. Lo administra un profesional de [employer]. Recibir una copia de informacin escrita sobre la vacuna antes de cada vacuna. Asegrese de leer este folleto cada vez cuidadosamente. Este folleto puede cambiar con frecuencia. Hable con su pediatra para informarse acerca del uso de este medicamento en nios. Puede requerir atencin especial. Sobredosis: Pngase en contacto inmediatamente con un centro toxicolgico o una sala de urgencia si usted cree que haya tomado demasiado medicamento. ATENCIN: [employer] es solo para usted. No comparta este medicamento con nadie. Qu sucede si me olvido de una dosis? No se aplica en este caso. Qu puede interactuar con este medicamento? -quimioterapia o  radioterapia -medicamentos que suprimen el sistema inmunolgico, tales como etanercept, anakinra, infliximab y adalimumab -medicamentos que tratan o previenen cogulos sanguneos, como warfarina -fenitona -medicamentos esteroideos, como la prednisona o la cortisona -teofilina -vacunas Puede ser que esta lista no menciona todas las posibles interacciones. Informe a su profesional de [employer] de In[employer] Inc productos a base de hierbas, medicamentos de [place] o suplementos nutritivos que est tomando. Si usted fuma, consume bebidas alcohlicas o si utiliza drogas ilegales, indqueselo tambin a su profesional de [employer]. Algunas sustancias pueden interactuar con su medicamento. A qu debo estar atento al usar [employer] Corporation? Informe a su mdico o a Producer, television/film/video de la [employer] todos los efectos secundarios que persistan despus de [address]. Llame a su proveedor de atencin mdica si se presentan sntomas inusuales dentro de las 6 semanas de recibir esta vacuna. Es posible que todava pueda contraer la gripe, pero la enfermedad no ser tan fuerte como normalmente. No puede contraer la gripe de esta vacuna. La vacuna antigripal no le protege contra resfros u otras enfermedades que pueden causar [place]. Debe vacunarse cada ao. Qu efectos secundarios puedo tener al [employer] este medicamento? Efectos secundarios que debe informar a su mdico o a Producer, television/film/video de la salud tan pronto como sea posible: -reacciones alrgicas como erupcin cutnea, picazn o urticarias, hinchazn de la cara, labios o lengua Efectos secundarios que, por lo general, no requieren atencin mdica (debe informarlos  a su mdico o a su profesional de la salud si persisten o si son molestos): -fiebre -dolor de cabeza -molestias y dolores musculares -dolor, sensibilidad, enrojecimiento o Paramedic de la inyeccin -cansancio Puede ser que esta lista no menciona todos los posibles efectos secundarios.  Comunquese a su mdico por asesoramiento mdico [employer]ard. Usted puede informar los efectos secundarios a la FDA por telfono al 1-800-FDA-1088. Dnde debo guardar mi medicina? Esta vacuna se administrar por un profesional de la salud en una [place], Corporate investment banker, consultorio mdico u otro consultorio de un profesional de la salud. No se le suministrar esta vacuna para guardar en su domicilio. ATENCIN: Este folleto es un resumen. Puede ser que no cubra toda la posible informacin. Si usted tiene preguntas acerca de esta medicina, consulte con su mdico, su farmacutico o su profesional de Radiographer, therapeutic.    2016, Elsevier/Gold Standard. (2011-02-18 16:29:16) Sndrome del tnel carpiano (Carpal Tunnel Syndrome) El sndrome del tnel carpiano es una afeccin que causa dolor en la mano y en el brazo. El tnel carpiano es un espacio estrecho ubicado en el lado palmar de la [place]. Los movimientos de la [place] o ciertas enfermedades pueden causar hinchazn del tnel. Esta hinchazn comprime el nervio principal de la mueca (nervio mediano). CAUSAS  Esta afeccin puede ser causada por lo siguiente:   Movimientos repetidos de [employer]  Lesiones en la [place].  Artritis.  Un quiste o un tumor en el tnel carpiano.  Acumulacin de lquido Academic librarian. A veces, se desconoce la causa de esta afeccin.  FACTORES DE RIESGO Es ms probable que esta afeccin se manifieste en:   Las personas que tienen trabajos en los que deben [employer] Inc mismos movimientos repetidos de las [place], como los carniceros y los cajeros.  Las mujeres.  Las personas que tienen determinadas enfermedades, por ejemplo:  Diabetes.  Obesidad.  Tiroides hipoactiva (hipotiroidismo).  Insuficiencia renal. SNTOMAS  Los sntomas de esta afeccin incluyen lo siguiente:   Sensacin de hormigueo en los dedos de la mano, Dentist, el ndice y el dedo [place].  Hormigueo o  adormecimiento en la mano.  Sensacin de Engineer, mining en todo el brazo, especialmente cuando la [place] y el codo estn flexionados durante [place].  Dolor en la mueca que sube por el brazo hasta el hombro.  Dolor que baja por la mano o los dedos.  Sensacin de [employer]. Tal vez tenga dificultad para tomar y Personal assistant. Los sntomas pueden empeorar durante la noche.  DIAGNSTICO  Esta afeccin se diagnostica mediante la historia clnica y un examen fsico. Tambin pueden hacerle exmenes, que incluyen los siguientes:   Electromiografa (EMG). Esta prueba mide las seales elctricas que los nervios les envan a los msculos.  Radiografas. TRATAMIENTO  El tratamiento de esta afeccin incluye lo siguiente:  Cambios en el estilo de vida. Es importante dejar de Radio producer o modificar la actividad que caus la afeccin.  Fisioterapia o terapia ocupacional.  Analgsicos y antiinflamatorios. Esto puede incluir medicamentos que se inyectan en la [place].  Una frula para la [place].  [place]. INSTRUCCIONES PARA EL CUIDADO EN EL HOGAR  Si tiene una frula:  sela como se lo haya indicado el mdico. Qutesela solamente como se lo haya indicado el mdico.  Afloje la frula si los dedos se le entumecen, siente hormigueos o se le enfran y se tornan de Research officer, trade union.  Mantenga la frula limpia y seca. Instrucciones generales  [employer]  medicamentos de venta libre y los recetados solamente como se lo haya indicado el mdico.  Haga reposar la [place] de toda actividad que le cause dolor. Si la afeccin tiene relacin con Kathie Dike, hable con su empleador [employer] pueden [place], por [place], usar una almohadilla para apoyar la mueca mientras tipea.  Si se lo indican, aplique hielo sobre la zona dolorida:  Ponga el hielo en una bolsa plstica.  Coloque una toalla entre la piel y la bolsa de hielo.  Coloque el hielo durante , 2 a 3veces por  Futures trader.  Concurra a todas las visitas de control como se lo haya indicado el mdico. Esto es importante.  Haga los ejercicios como se lo hayan indicado el mdico, el fisioterapeuta o el terapeuta ocupacional. SOLICITE ATENCIN MDICA SI:   Aparecen nuevos sntomas.  El dolor no se alivia con los [employer].  Los sntomas empeoran.   Esta informacin no tiene Theme park manager el consejo del mdico. Asegrese de hacerle al mdico cualquier pregunta que tenga.   Document Released: 03/04/2005 Document Revised: 11/23/2014 Elsevier Interactive Patient Education 2016 Elsevier Inc. Gentamicin eye drops Qu es [place] medicamento? La [place] es un antibitico aminoglucsido. Este medicamento se [place] en el tratamiento de ciertos tipos de infecciones bacterianas de los ojos. Este medicamento puede ser utilizado para otros usos; si tiene alguna pregunta consulte con su proveedor de atencin mdica o con su farmacutico. Qu le debo informar a mi profesional de la salud antes de tomar este medicamento? Necesita saber si usted presenta alguno de los siguientes problemas o situaciones: -una reaccin alrgica o inusual a la gentamicina, a otros antibiticos, sulfitos, alimentos, colorantes o conservantes -si est embarazada o buscando quedar embarazada -si est amamantando a un beb Cmo debo utilizar este medicamento? Este medicamento es para utilizarse [employer] ojos. No lo tome por va oral. Siga las instrucciones de la etiqueta del medicamento. Lvese las manos antes y despus de usarlo. Incline la cabeza ligeramente hacia atrs y lleve el prpado inferior hacia abajo con su dedo ndice para formar [employer]. Trate la punta del gotero no toque el ojo, las yemas de los dedos ni ninguna otra superficie. Aplique la cantidad de gotas recetada dentro de la bolsa. Cierre el ojo suavemente para que se esparzan las gotas. Se le puede nublar la vista por algunos minutos. Es Advertising account executive todo el tratamiento con el medicamento, aun si considera que su problema ha mejorado. No deje de utilizarlo excepto si as lo indica su mdico o su profesional de [employer]. Hable con su pediatra para informarse acerca del uso de este medicamento en nios. Puede requerir atencin especial. Sobredosis: Pngase en contacto inmediatamente con un centro toxicolgico o una sala de urgencia si usted cree que haya tomado demasiado medicamento. ATENCIN: [employer] es solo para usted. No comparta este medicamento con nadie. Qu sucede si me olvido de una dosis? Si olvida una dosis, aplquela lo antes posible. Si es casi la hora de la prxima dosis, aplique slo esa dosis. No use dosis adicionales o dobles. Qu puede interactuar con este medicamento? No se esperan interacciones. No utilice otros productos para los ojos sin Science writer a su mdico o a su profesional de Radiographer, therapeutic. Puede ser que esta lista no menciona todas las posibles interacciones. Informe a su profesional de [employer] de In[employer] Inc productos a base de hierbas, medicamentos de [place] o suplementos nutritivos que est tomando. Si usted fuma, consume bebidas alcohlicas o  si utiliza drogas ilegales, indqueselo tambin a su profesional de Beazer Homesla salud. Algunas sustancias pueden interactuar con su medicamento. A qu debo estar atento al usar PPL Corporationeste medicamento? Si los sntomas no comienzan a mejorar despus de unos [address], consulte con su mdico o con su profesional de Beazer Homesla salud. Si experimenta una reaccin de ardor o picazn que no desaparece, significa que usted es alrgico a Hospital doctoreste producto. Dejar de utilizarlo, comunquese con su mdico o con su profesional de Radiographer, therapeuticla salud. Este medicamento puede causar el empeoramiento de ciertos problemas oculares. Utilcelo slo para aquellos problemas para los cuales su mdico o su profesional de la salud las haya recetado. Para evitar la propagacin de la infeccin, no comparta productos para los  ojos, toallas de bao ni toallas de mano con [place]una otra persona. Qu efectos secundarios puedo tener al Boston Scientificutilizar este medicamento? Efectos secundarios que debe informar a su mdico o a Producer, television/film/videosu profesional de la salud tan pronto como sea posible: -ardor, escozor o irritacin -dificultad para or o zumbidos en los odos -mareos -aumento de la sed -falta de equilibrio -debilidad muscular -nuseas -dolor o dificultad para orinar Efectos secundarios que, por lo general, no requieren atencin mdica (debe informarlos a su mdico o a su profesional de la salud si persisten o si son molestos): -visin borrosa (generalmente temporaria) Puede ser que esta lista no menciona todos los posibles efectos secundarios. Comunquese a su mdico por asesoramiento mdico Hewlett-Packardsobre los efectos secundarios. Usted puede informar los efectos secundarios a la FDA por telfono al 1-800-FDA-1088. Dnde debo guardar mi medicina? Mantngala fuera del alcance de los nios. Gurdela a una temperatura de [place]ou[place]entre 2 y 30 grados C (36 y 5686 grados F). No la congele. Deseche todo el medicamento que no haya utilizado, despus de la fecha de vencimiento. ATENCIN: Este folleto es un resumen. Puede ser que no cubra toda la posible informacin. Si usted tiene preguntas acerca de esta medicina, consulte con su mdico, su farmacutico o su profesional de Radiographer, therapeuticla salud.    2016, Elsevier/Gold Standard. (2014-04-26 00:00:00)

## 2015-11-16 NOTE — Progress Notes (Signed)
Christie Stone 06/10/1970 [id number]   History:    45 y.o.  for annual gyn exam who has several complaints today: Patient is complaining of on and off pain or rectum during intercourse although she is not having any rectal intercourse but had questions. Also patient complaining of some numbness in her 3 middle fingers at times sometimes tingling from her right elbow. Also patient complaining of irritation on her right eye for the past few days. [place] secondary to dysmenorrhea and menorrhagia fibroid uterus. Prior to that she reports no history of any abnormal Pap smear. In 2015 patient had a mammogram and ultrasound because of a palpable area in the right breast the radiologist reported as a benign appearing cyst and recommended follow-up in one year and her mammogram last year was normal.  Past medical history,surgical history, family history and social history were all reviewed and documented in the EPIC chart.  Gynecologic History No LMP recorded. Patient has had a hysterectomy. Contraception: status post hysterectomy Last Pap: 2015. Results were: normal Last mammogram: 2016. Results were: normal  Obstetric History OB History  Gravida Para Term Preterm AB Living  2 2       2   SAB TAB Ectopic Multiple Live Births               # Outcome Date GA Lbr Len/2nd Weight Sex Delivery Anes PTL Lv  2 Para           1 Para                ROS: A ROS was performed and pertinent positives and negatives are included in the history.  GENERAL: No fevers or chills. HEENT: Irritation right eye conjunctiva NECK: No pain or stiffness. CARDIOVASCULAR: No chest pain or pressure. No palpitations. PULMONARY: No shortness of breath, cough or wheeze. GASTROINTESTINAL: No abdominal pain, nausea, vomiting or diarrhea, melena or bright red blood per rectum. GENITOURINARY: No urinary frequency, urgency, hesitancy or dysuria. MUSCULOSKELETAL: Tingling  in right elbow at times radiating down to her 3 middle fingers. DERMATOLOGIC: No rash, no itching, no lesions. ENDOCRINE: No polyuria, polydipsia, no heat or cold intolerance. No recent change in weight. HEMATOLOGICAL: No anemia or easy bruising or bleeding. NEUROLOGIC: No headache, seizures, numbness, tingling or weakness. PSYCHIATRIC: No depression, no loss of interest in normal activity or change in sleep pattern.     Exam: chaperone present  BP (!) 144/90   Ht 5\' 3"  (1.6 m)   Wt 154 lb (69.9 kg)   BMI 27.28 kg/m   Body mass index is 27.28 kg/m.  General appearance : Well developed well nourished female. No acute distress HEENT: Right eye conjunctivitis,  Neck supple, trachea midline, no carotid bruits, no thyroidmegaly Lungs: Clear to auscultation, no rhonchi or wheezes, or rib retractions  Heart: Regular rate and rhythm, no murmurs or gallops Breast:Examined in sitting and supine position were symmetrical in appearance, no palpable masses or tenderness,  no skin retraction, no nipple inversion, no nipple discharge, no skin discoloration, no axillary or supraclavicular lymphadenopathy Abdomen: no palpable masses or tenderness, no rebound or guarding Extremities: Right elbow tingling and middle 3 fingers consistent with carpal tunnel syndrome  Pelvic:  Bartholin, Urethra, Skene Glands: Within normal limits             Vagina: No gross lesions or discharge  Cervix: Absent  Uterus  absent  Adnexa  Without masses or tenderness  Anus and perineum  normal   Rectovaginal  normal sphincter tone without palpated masses or tenderness             Hemoccult not indicated   Urinalysis today few bacteria culture pending  Assessment/Plan:  45 y.o. female for annual exam with clinical evidence of right carpal tunnel syndrome. We discussed different exercises she can use but also recommend for her to purchase at one of the medical supply facilities for a right wrist brace to wear during the  day. For her right eye conjunctivitis I'm going to prescribe her gentamicin (Garamycin) 0.3% ophthalmic solution to apply 2 drops to each eye 4 times a day for 5-7 days until her conjunctivitis clears up. A she'll also passed by the lab for the following screening blood work today: CBC, comprehensive metabolic panel, fasting lipid profile, TSH, and urinalysis. Patient was counseled and received the flu vaccine today. Patient was provided with literature information in Spanish on carpal tunnel syndrome, conjunctivitis, and flu vaccine. We also discussed importance of calcium vitamin D and weightbearing exercises for osteoporosis prevention. We also discussed importance of monthly breast exam. And also she is due for her mammogram.   Ok Edwards MD, 10:34 AM 11/16/2015

## 2015-11-17 ENCOUNTER — Other Ambulatory Visit: Payer: Self-pay | Admitting: Anesthesiology

## 2015-11-17 MED ORDER — GENTAMICIN SULFATE 0.3 % OP SOLN: OPHTHALMIC | 0 refills | Status: DC

## 2015-11-18 LAB — URINE CULTURE: Organism ID, Bacteria: NO GROWTH

## 2015-12-14 ENCOUNTER — Other Ambulatory Visit: Payer: Self-pay | Admitting: Gynecology

## 2015-12-14 DIAGNOSIS — Z1231 Encounter for screening mammogram for malignant neoplasm of breast: Secondary | ICD-10-CM

## 2016-01-01 ENCOUNTER — Ambulatory Visit: Payer: BLUE CROSS/BLUE SHIELD

## 2016-01-16 ENCOUNTER — Ambulatory Visit
Admission: RE | Admit: 2016-01-16 | Discharge: 2016-01-16 | Disposition: A | Discharge status: Home / Self Care | Payer: No Typology Code available for payment source | Source: Ambulatory Visit | Attending: Gynecology | Admitting: Gynecology

## 2016-01-16 DIAGNOSIS — Z1231 Encounter for screening mammogram for malignant neoplasm of breast: Secondary | ICD-10-CM

## 2016-07-31 ENCOUNTER — Encounter: Payer: Self-pay | Admitting: Gynecology

## 2017-12-23 ENCOUNTER — Encounter (HOSPITAL_COMMUNITY): Payer: Self-pay | Admitting: *Deleted

## 2017-12-25 ENCOUNTER — Ambulatory Visit (HOSPITAL_COMMUNITY)
Admission: RE | Admit: 2017-12-25 | Discharge: 2017-12-25 | Disposition: A | Discharge status: Home / Self Care | Payer: Self-pay | Source: Ambulatory Visit | Attending: Obstetrics and Gynecology | Admitting: Obstetrics and Gynecology

## 2017-12-25 ENCOUNTER — Encounter (HOSPITAL_COMMUNITY): Payer: Self-pay

## 2017-12-25 VITALS — BP 114/72 | Wt 150.2 lb

## 2017-12-25 DIAGNOSIS — Z1239 Encounter for other screening for malignant neoplasm of breast: Secondary | ICD-10-CM

## 2017-12-25 HISTORY — DX: Essential (primary) hypertension: I10

## 2017-12-25 NOTE — Progress Notes (Signed)
Patient referred to [facility] by [facility] due to having a screening mammogram on 12/17/2017 that additional imaging of the right breast was recommended. A left breast diagnostic mammogram was completed 12/18/2017 that a left breast biopsy is recommended for follow-up.  Pap Smear: Pap smear not completed today. Last Pap smear was 11/02/2013 at [facility] and normal with negative HPV. Per patient has no history of an abnormal Pap smear. Patient has a history of a hysterectomy 13 years ago due to cysts. Patient no longer needs Pap smears due to her history of a hysterectomy for benign reasons per BCCCP and ACOG guidelines. Last Pap smear result is in Epic.  Physical exam: Breasts Breasts symmetrical. No skin abnormalities bilateral breasts. No nipple retraction bilateral breasts. No nipple discharge bilateral breasts. No lymphadenopathy. No lumps palpated bilateral breasts. No complaints of pain or tenderness on exam. Referred patient to [facility] for a left breast biopsy per recommendation. Appointment scheduled for Monday, December 29, 2017 at 1000.        Pelvic/Bimanual No Pap smear completed today since patient has a history of a hysterectomy for benign reasons. Pap smear not indicated per BCCCP guidelines.   Smoking History: Patient has never smoked.  Patient Navigation: Patient education provided. Access to services provided for patient through [facility] program. Spanish interpreter provided.   Breast and Cervical Cancer Risk Assessment: Patient has no family history of breast cancer, known genetic mutations, or radiation treatment to the chest before age 49. Patient has no history of cervical dysplasia, immunocompromised, or DES exposure in-utero.  Risk Assessment    Risk Scores      12/25/2017   Last edited by: Lynnell Dike, LPN   5-year risk: 0.7 %   Lifetime risk: 7.2 %         Used Spanish interpreter Leana Roe from [facility].

## 2017-12-25 NOTE — Patient Instructions (Signed)
Explained breast self awareness with Christie Stone. Patient did not need a Pap smear today due to last Pap smear wand HPV typing was 11/02/2013 and patient has a history of a hysterectomy for benign reasons. Let patient know that she doesn't need any further Pap smears due to her history of a hysterectomy for benign reasons. Referred patient to [facility] for a left breast biopsy per recommendation. Appointment scheduled for Monday, December 29, 2017 at 1000. Patient aware of appointment and will be there. Christie Stone verbalized understanding.  Christie Stone, Kathaleen Maser, RN 8:15 AM

## 2017-12-29 ENCOUNTER — Other Ambulatory Visit: Payer: Self-pay | Admitting: Radiology

## 2017-12-29 ENCOUNTER — Encounter (HOSPITAL_COMMUNITY): Payer: Self-pay

## 2018-01-07 ENCOUNTER — Ambulatory Visit: Payer: Self-pay | Admitting: General Surgery

## 2018-01-07 DIAGNOSIS — N6092 Unspecified benign mammary dysplasia of left breast: Secondary | ICD-10-CM

## 2018-01-14 ENCOUNTER — Encounter (HOSPITAL_COMMUNITY): Payer: Self-pay | Admitting: *Deleted

## 2018-01-26 ENCOUNTER — Other Ambulatory Visit: Payer: Self-pay

## 2018-01-26 ENCOUNTER — Encounter (HOSPITAL_BASED_OUTPATIENT_CLINIC_OR_DEPARTMENT_OTHER): Payer: Self-pay | Admitting: *Deleted

## 2018-01-27 ENCOUNTER — Encounter (HOSPITAL_BASED_OUTPATIENT_CLINIC_OR_DEPARTMENT_OTHER)
Admission: RE | Admit: 2018-01-27 | Discharge: 2018-01-27 | Disposition: A | Discharge status: Home / Self Care | Payer: No Typology Code available for payment source | Source: Ambulatory Visit | Attending: General Surgery | Admitting: General Surgery

## 2018-01-27 DIAGNOSIS — Z01818 Encounter for other preprocedural examination: Secondary | ICD-10-CM | POA: Insufficient documentation

## 2018-01-27 LAB — BASIC METABOLIC PANEL
ANION GAP: 6 (ref 5–15)
BUN: 10 mg/dL (ref 6–20)
CALCIUM: 9.2 mg/dL (ref 8.9–10.3)
CO2: 27 mmol/L (ref 22–32)
Chloride: 103 mmol/L (ref 98–111)
Creatinine, Ser: 0.72 mg/dL (ref 0.44–1.00)
GFR calc Af Amer: >60 mL/min (ref >60)
GLUCOSE: 95 mg/dL (ref 70–99)
Potassium: 4.2 mmol/L (ref 3.5–5.1)
Sodium: 136 mmol/L (ref 135–145)

## 2018-01-27 NOTE — Progress Notes (Addendum)
Ensure pre surgery drink given with instructions to complete by 0900 dos, pt verbalized understanding. EKG reviewed by Dr. Germeroth, will proceed with surgery as scheduled. 

## 2018-01-30 ENCOUNTER — Encounter (HOSPITAL_BASED_OUTPATIENT_CLINIC_OR_DEPARTMENT_OTHER)
Admission: RE | Disposition: A | Discharge status: Home / Self Care | Payer: Self-pay | Source: Ambulatory Visit | Attending: General Surgery

## 2018-01-30 ENCOUNTER — Ambulatory Visit (HOSPITAL_BASED_OUTPATIENT_CLINIC_OR_DEPARTMENT_OTHER): Payer: Self-pay | Admitting: Certified Registered"

## 2018-01-30 ENCOUNTER — Other Ambulatory Visit: Payer: Self-pay

## 2018-01-30 ENCOUNTER — Encounter (HOSPITAL_BASED_OUTPATIENT_CLINIC_OR_DEPARTMENT_OTHER): Payer: Self-pay

## 2018-01-30 ENCOUNTER — Ambulatory Visit (HOSPITAL_BASED_OUTPATIENT_CLINIC_OR_DEPARTMENT_OTHER)
Admission: RE | Admit: 2018-01-30 | Discharge: 2018-01-30 | Disposition: A | Discharge status: Home / Self Care | Payer: Self-pay | Source: Ambulatory Visit | Attending: General Surgery | Admitting: General Surgery

## 2018-01-30 DIAGNOSIS — N6092 Unspecified benign mammary dysplasia of left breast: Secondary | ICD-10-CM | POA: Insufficient documentation

## 2018-01-30 DIAGNOSIS — I10 Essential (primary) hypertension: Secondary | ICD-10-CM | POA: Insufficient documentation

## 2018-01-30 DIAGNOSIS — Z79899 Other long term (current) drug therapy: Secondary | ICD-10-CM | POA: Insufficient documentation

## 2018-01-30 DIAGNOSIS — Z792 Long term (current) use of antibiotics: Secondary | ICD-10-CM | POA: Insufficient documentation

## 2018-01-30 DIAGNOSIS — N6022 Fibroadenosis of left breast: Secondary | ICD-10-CM | POA: Insufficient documentation

## 2018-01-30 DIAGNOSIS — Z791 Long term (current) use of non-steroidal anti-inflammatories (NSAID): Secondary | ICD-10-CM | POA: Insufficient documentation

## 2018-01-30 HISTORY — PX: BREAST LUMPECTOMY WITH RADIOACTIVE SEED LOCALIZATION: SHX6424

## 2018-01-30 HISTORY — DX: Unspecified lump in the left breast, unspecified quadrant: N63.20

## 2018-01-30 SURGERY — BREAST LUMPECTOMY WITH RADIOACTIVE SEED LOCALIZATION
Anesthesia: General | Site: Breast | Laterality: Left

## 2018-01-30 MED ORDER — BUPIVACAINE-EPINEPHRINE (PF) 0.25% -1:200000 IJ SOLN
INTRAMUSCULAR | Status: DC | PRN
  Administered 2018-01-30: 10 mL via PERINEURAL

## 2018-01-30 MED ORDER — FENTANYL CITRATE (PF) 100 MCG/2ML IJ SOLN
50.0000 ug | INTRAMUSCULAR | Status: DC | PRN
  Administered 2018-01-30: 100 ug via INTRAVENOUS

## 2018-01-30 MED ORDER — MIDAZOLAM HCL 2 MG/2ML IJ SOLN
INTRAMUSCULAR | Status: AC
  Filled 2018-01-30: qty 2

## 2018-01-30 MED ORDER — PROPOFOL 10 MG/ML IV BOLUS
INTRAVENOUS | Status: DC | PRN
  Administered 2018-01-30: 200 mg via INTRAVENOUS

## 2018-01-30 MED ORDER — ONDANSETRON HCL 4 MG/2ML IJ SOLN
INTRAMUSCULAR | Status: DC | PRN
  Administered 2018-01-30: 4 mg via INTRAVENOUS

## 2018-01-30 MED ORDER — OXYCODONE HCL 5 MG PO TABS: 5.0000 mg | ORAL_TABLET | Freq: Once | ORAL | Status: DC | PRN

## 2018-01-30 MED ORDER — CELECOXIB 200 MG PO CAPS
200.0000 mg | ORAL_CAPSULE | ORAL | Status: AC
  Administered 2018-01-30: 200 mg via ORAL

## 2018-01-30 MED ORDER — CEFAZOLIN SODIUM-DEXTROSE 2-4 GM/100ML-% IV SOLN
INTRAVENOUS | Status: AC
  Filled 2018-01-30: qty 100

## 2018-01-30 MED ORDER — GABAPENTIN 300 MG PO CAPS
ORAL_CAPSULE | ORAL | Status: AC
  Filled 2018-01-30: qty 1

## 2018-01-30 MED ORDER — PROMETHAZINE HCL 25 MG/ML IJ SOLN: 6.2500 mg | INTRAMUSCULAR | Status: DC | PRN

## 2018-01-30 MED ORDER — LIDOCAINE HCL (CARDIAC) PF 100 MG/5ML IV SOSY
PREFILLED_SYRINGE | INTRAVENOUS | Status: DC | PRN
  Administered 2018-01-30: 60 mg via INTRAVENOUS

## 2018-01-30 MED ORDER — HYDROCODONE-ACETAMINOPHEN 5-325 MG PO TABS: 1.0000 | ORAL_TABLET | Freq: Four times a day (QID) | ORAL | 0 refills | Status: DC | PRN

## 2018-01-30 MED ORDER — SCOPOLAMINE 1 MG/3DAYS TD PT72: 1.0000 | MEDICATED_PATCH | Freq: Once | TRANSDERMAL | Status: DC | PRN

## 2018-01-30 MED ORDER — CHLORHEXIDINE GLUCONATE CLOTH 2 % EX PADS: 6.0000 | MEDICATED_PAD | Freq: Once | CUTANEOUS | Status: DC

## 2018-01-30 MED ORDER — LACTATED RINGERS IV SOLN
INTRAVENOUS | Status: DC
  Administered 2018-01-30: 15:00:00 via INTRAVENOUS

## 2018-01-30 MED ORDER — ACETAMINOPHEN 500 MG PO TABS
1000.0000 mg | ORAL_TABLET | ORAL | Status: AC
  Administered 2018-01-30: 1000 mg via ORAL

## 2018-01-30 MED ORDER — MEPERIDINE HCL 25 MG/ML IJ SOLN: 6.2500 mg | INTRAMUSCULAR | Status: DC | PRN

## 2018-01-30 MED ORDER — FENTANYL CITRATE (PF) 100 MCG/2ML IJ SOLN
INTRAMUSCULAR | Status: AC
  Filled 2018-01-30: qty 2

## 2018-01-30 MED ORDER — MIDAZOLAM HCL 2 MG/2ML IJ SOLN
1.0000 mg | INTRAMUSCULAR | Status: DC | PRN
  Administered 2018-01-30: 2 mg via INTRAVENOUS

## 2018-01-30 MED ORDER — LACTATED RINGERS IV SOLN
INTRAVENOUS | Status: DC
  Administered 2018-01-30: 12:00:00 via INTRAVENOUS

## 2018-01-30 MED ORDER — GABAPENTIN 300 MG PO CAPS
300.0000 mg | ORAL_CAPSULE | ORAL | Status: AC
  Administered 2018-01-30: 300 mg via ORAL

## 2018-01-30 MED ORDER — CELECOXIB 200 MG PO CAPS
ORAL_CAPSULE | ORAL | Status: AC
  Filled 2018-01-30: qty 1

## 2018-01-30 MED ORDER — CEFAZOLIN SODIUM-DEXTROSE 2-4 GM/100ML-% IV SOLN
2.0000 g | INTRAVENOUS | Status: AC
  Administered 2018-01-30: 2 g via INTRAVENOUS

## 2018-01-30 MED ORDER — DEXAMETHASONE SODIUM PHOSPHATE 4 MG/ML IJ SOLN
INTRAMUSCULAR | Status: DC | PRN
  Administered 2018-01-30: 10 mg via INTRAVENOUS

## 2018-01-30 MED ORDER — HYDROMORPHONE HCL 1 MG/ML IJ SOLN: 0.2500 mg | INTRAMUSCULAR | Status: DC | PRN

## 2018-01-30 MED ORDER — OXYCODONE HCL 5 MG/5ML PO SOLN: 5.0000 mg | Freq: Once | ORAL | Status: DC | PRN

## 2018-01-30 MED ORDER — ACETAMINOPHEN 500 MG PO TABS
ORAL_TABLET | ORAL | Status: AC
  Filled 2018-01-30: qty 2

## 2018-01-30 SURGICAL SUPPLY — 47 items
ADH SKN CLS APL DERMABOND .7 (GAUZE/BANDAGES/DRESSINGS) ×1
APPLIER CLIP 9.375 MED OPEN (MISCELLANEOUS)
APR CLP MED 9.3 20 MLT OPN (MISCELLANEOUS)
BLADE SURG 15 STRL LF DISP TIS (BLADE) ×1 IMPLANT
BLADE SURG 15 STRL SS (BLADE) ×3
CANISTER SUC SOCK COL 7IN (MISCELLANEOUS) ×1 IMPLANT
CANISTER SUCT 1200ML W/VALVE (MISCELLANEOUS) ×3 IMPLANT
CHLORAPREP W/TINT 26ML (MISCELLANEOUS) ×3 IMPLANT
CLIP APPLIE 9.375 MED OPEN (MISCELLANEOUS) IMPLANT
COVER BACK TABLE 60X90IN (DRAPES) ×3 IMPLANT
COVER MAYO STAND STRL (DRAPES) ×3 IMPLANT
COVER PROBE W GEL 5X96 (DRAPES) ×3 IMPLANT
COVER WAND RF STERILE (DRAPES) IMPLANT
DECANTER SPIKE VIAL GLASS SM (MISCELLANEOUS) IMPLANT
DERMABOND ADVANCED (GAUZE/BANDAGES/DRESSINGS) ×2
DERMABOND ADVANCED .7 DNX12 (GAUZE/BANDAGES/DRESSINGS) ×1 IMPLANT
DEVICE DUBIN W/COMP PLATE 8390 (MISCELLANEOUS) ×3 IMPLANT
DRAPE LAPAROSCOPIC ABDOMINAL (DRAPES) ×3 IMPLANT
DRAPE UTILITY XL STRL (DRAPES) ×3 IMPLANT
ELECT COATED BLADE 2.86 ST (ELECTRODE) ×3 IMPLANT
ELECT REM PT RETURN 9FT ADLT (ELECTROSURGICAL) ×3
ELECTRODE REM PT RTRN 9FT ADLT (ELECTROSURGICAL) ×1 IMPLANT
GLOVE BIO SURGEON STRL SZ7 (GLOVE) ×4 IMPLANT
GLOVE BIO SURGEON STRL SZ7.5 (GLOVE) ×6 IMPLANT
GLOVE BIOGEL PI IND STRL 7.5 (GLOVE) IMPLANT
GLOVE BIOGEL PI INDICATOR 7.5 (GLOVE) ×2
GOWN STRL REUS W/ TWL LRG LVL3 (GOWN DISPOSABLE) ×2 IMPLANT
GOWN STRL REUS W/TWL LRG LVL3 (GOWN DISPOSABLE) ×6
ILLUMINATOR WAVEGUIDE N/F (MISCELLANEOUS) IMPLANT
KIT MARKER MARGIN INK (KITS) ×3 IMPLANT
LIGHT WAVEGUIDE WIDE FLAT (MISCELLANEOUS) ×2 IMPLANT
NDL HYPO 25X1 1.5 SAFETY (NEEDLE) IMPLANT
NEEDLE HYPO 25X1 1.5 SAFETY (NEEDLE) IMPLANT
NS IRRIG 1000ML POUR BTL (IV SOLUTION) ×2 IMPLANT
PACK BASIN DAY SURGERY FS (CUSTOM PROCEDURE TRAY) ×3 IMPLANT
PENCIL BUTTON HOLSTER BLD 10FT (ELECTRODE) ×3 IMPLANT
SLEEVE SCD COMPRESS KNEE MED (MISCELLANEOUS) ×3 IMPLANT
SPONGE LAP 18X18 RF (DISPOSABLE) ×3 IMPLANT
SUT MON AB 4-0 PC3 18 (SUTURE) ×2 IMPLANT
SUT SILK 2 0 SH (SUTURE) IMPLANT
SUT VICRYL 3-0 CR8 SH (SUTURE) ×3 IMPLANT
SYR CONTROL 10ML LL (SYRINGE) ×2 IMPLANT
TOWEL GREEN STERILE FF (TOWEL DISPOSABLE) ×3 IMPLANT
TOWEL OR NON WOVEN STRL DISP B (DISPOSABLE) ×1 IMPLANT
TUBE CONNECTING 20'X1/4 (TUBING) ×1
TUBE CONNECTING 20X1/4 (TUBING) ×2 IMPLANT
YANKAUER SUCT BULB TIP NO VENT (SUCTIONS) IMPLANT

## 2018-01-30 NOTE — H&P (Signed)
Christie Stone  [facility] Patient #: [id number] DOB: 10/04/1970 Married / Language: Spanish; Castilian / Race: White Female   History of Present Illness  The patient is a 47 year old female who presents with a breast mass. We are asked to see the patient in consultation by Dr. Rosalva FerronJohn Faris to evaluate her for atypical ductal hyperplasia of the left breast. The patient is a 47 year old Hispanic female who recently went for a routine screening mammogram. At that time she was found to have an abnormal area of calcification in the upper outer left breast. This measured 1.9 cm. It was biopsied and came back as atypical duct hyperplasia. She denies any breast pain or discharge from the nipple. She has no family history of breast cancer or any other cancer.   Allergies  No Known Drug Allergies  Allergies Reconciled   Medication History  Ciprofloxacin HCl (250MG  Tablet, Oral) Active. AzaSite (1% Solution, Ophthalmic) Active. Fluconazole (150MG  Tablet, Oral) Active. Indomethacin (25MG  Capsule, Oral) Active. Nitrofurantoin Monohyd Macro (100MG  Capsule, Oral) Active. Ondansetron (4MG  Tablet Disint, Oral) Active. Oxybutynin Chloride ER (5MG  Tablet ER 24HR, Oral) Active. Phenazopyridine HCl (200MG  Tablet, Oral) Active. Sulfamethoxazole-TMP DS (800-160MG  Tablet, Oral) Active. Proctosol HC (2.5% Cream, Rectal) Active. Medications Reconciled    Review of Systems  General Not Present- Appetite Loss, Chills, Fatigue, Fever, Night Sweats, Weight Gain and Weight Loss. Note: All other systems negative (unless as noted in HPI & included Review of Systems) Skin Not Present- Change in Wart/Mole, Dryness, Hives, Jaundice, New Lesions, Non-Healing Wounds, Rash and Ulcer. HEENT Not Present- Earache, Hearing Loss, Hoarseness, Nose Bleed, Oral Ulcers, Ringing in the Ears, Seasonal Allergies, Sinus Pain, Sore Throat, Visual Disturbances, Wears glasses/contact lenses and  Yellow Eyes. Respiratory Not Present- Bloody sputum, Chronic Cough, Difficulty Breathing, Snoring and Wheezing. Breast Not Present- Breast Mass, Breast Pain, Nipple Discharge and Skin Changes. Cardiovascular Not Present- Chest Pain, Difficulty Breathing Lying Down, Leg Cramps, Palpitations, Rapid Heart Rate, Shortness of Breath and Swelling of Extremities. Gastrointestinal Not Present- Abdominal Pain, Bloating, Bloody Stool, Change in Bowel Habits, Chronic diarrhea, Constipation, Difficulty Swallowing, Excessive gas, Gets full quickly at meals, Hemorrhoids, Indigestion, Nausea, Rectal Pain and Vomiting. Female Genitourinary Not Present- Frequency, Nocturia, Painful Urination, Pelvic Pain and Urgency. Musculoskeletal Not Present- Back Pain, Joint Pain, Joint Stiffness, Muscle Pain, Muscle Weakness and Swelling of Extremities. Neurological Not Present- Decreased Memory, Fainting, Headaches, Numbness, Seizures, Tingling, Tremor, Trouble walking and Weakness. Psychiatric Not Present- Anxiety, Bipolar, Change in Sleep Pattern, Depression, Fearful and Frequent crying. Endocrine Not Present- Cold Intolerance, Excessive Hunger, Hair Changes, Heat Intolerance, Hot flashes and New Diabetes. Hematology Not Present- Easy Bruising, Excessive bleeding, Gland problems, HIV and Persistent Infections.  Vitals  Weight: 150 lb Height: 64in Body Surface Area: 1.73 m Body Mass Index: 25.75 kg/m  Temp.: 98.108F(Oral)  Pulse: 84 (Regular)  BP: 124/82 (Sitting, Left Arm, Standard)       Physical Exam General Mental Status-Alert. General Appearance-Consistent with stated age. Hydration-Well hydrated. Voice-Normal.  Head and Neck Head-normocephalic, atraumatic with no lesions or palpable masses. Trachea-midline. Thyroid Gland Characteristics - normal size and consistency.  Eye Eyeball - Bilateral-Extraocular movements intact. Sclera/Conjunctiva - Bilateral-No scleral  icterus.  Chest and Lung Exam Chest and lung exam reveals -quiet, even and easy respiratory effort with no use of accessory muscles and on auscultation, normal breath sounds, no adventitious sounds and normal vocal resonance. Inspection Chest Wall - Normal. Back - normal.  Breast Note: There is a small palpable bruise in  the upper outer left breast. There is no other palpable mass in the left breast. There is a round palpable mass in the upper inner central right breast that she says has been there for many years and is unchanged. There is no palpable axillary, supraclavicular, or cervical lymphadenopathy.   Cardiovascular Cardiovascular examination reveals -normal heart sounds, regular rate and rhythm with no murmurs and normal pedal pulses bilaterally.  Abdomen Inspection Inspection of the abdomen reveals - No Hernias. Skin - Scar - no surgical scars. Palpation/Percussion Palpation and Percussion of the abdomen reveal - Soft, Non Tender, No Rebound tenderness, No Rigidity (guarding) and No hepatosplenomegaly. Auscultation Auscultation of the abdomen reveals - Bowel sounds normal.  Neurologic Neurologic evaluation reveals -alert and oriented x 3 with no impairment of recent or remote memory. Mental Status-Normal.  Musculoskeletal Normal Exam - Left-Upper Extremity Strength Normal and Lower Extremity Strength Normal. Normal Exam - Right-Upper Extremity Strength Normal and Lower Extremity Strength Normal.  Lymphatic Head & Neck  General Head & Neck Lymphatics: Bilateral - Description - Normal. Axillary  General Axillary Region: Bilateral - Description - Normal. Tenderness - Non Tender. Femoral & Inguinal  Generalized Femoral & Inguinal Lymphatics: Bilateral - Description - Normal. Tenderness - Non Tender.    Assessment & Plan  ATYPICAL DUCTAL HYPERPLASIA OF LEFT BREAST (N60.92) Impression: The patient appears to have a 1.9 cm area of atypical ductal  hyperplasia in the upper outer left breast. Because this is considered a high risk lesion and because it can have an appearance similar to ductal carcinoma in situ I would recommend that this area be removed. She would also like to have this done. I have discussed with her in detail the risks and benefits of the operation as well as some of the technical aspects and she understands and wishes to proceed. I will plan for a left breast radioactive seed localized lumpectomy Current Plans Referred to Oncology, for evaluation and follow up (Oncology). Routine.

## 2018-01-30 NOTE — Transfer of Care (Signed)
Immediate Anesthesia Transfer of Care Note  Patient: Christie Stone  Procedure(s) Performed: LEFT BREAST LUMPECTOMY WITH RADIOACTIVE SEED LOCALIZATION (Left Breast)  Patient Location: PACU  Anesthesia Type:General  Level of Consciousness: drowsy and patient cooperative  Airway & Oxygen Therapy: Patient Spontanous Breathing and Patient connected to face mask oxygen  Post-op Assessment: Report given to RN and Post -op Vital signs reviewed and stable  Post vital signs: Reviewed and stable  Last Vitals:  Vitals Value Taken Time  BP 105/62 01/30/2018  1:27 PM  Temp    Pulse 80 01/30/2018  1:28 PM  Resp 15 01/30/2018  1:28 PM  SpO2 100 % 01/30/2018  1:28 PM  Vitals shown include unvalidated device data.  Last Pain:  Vitals:   01/30/18 1123  TempSrc: Oral  PainSc: 0-No pain         Complications: No apparent anesthesia complications

## 2018-01-30 NOTE — Interval H&P Note (Signed)
History and Physical Interval Note:  01/30/2018 12:13 PM  Christie Stone  has presented today for surgery, with the diagnosis of LEFT BREAST ATYPICAL DUCTAL HYPERPLASIA  The various methods of treatment have been discussed with the patient and family. After consideration of risks, benefits and other options for treatment, the patient has consented to  Procedure(s): LEFT BREAST LUMPECTOMY WITH RADIOACTIVE SEED LOCALIZATION (Left) as a surgical intervention .  The patient's history has been reviewed, patient examined, no change in status, stable for surgery.  I have reviewed the patient's chart and labs.  Questions were answered to the patient's satisfaction.     Chevis PrettyPaul Toth III

## 2018-01-30 NOTE — Anesthesia Procedure Notes (Signed)
Procedure Name: LMA Insertion Date/Time: 01/30/2018 12:34 PM Performed by: Sheryn BisonBlocker, Ira Busbin D, CRNA Pre-anesthesia Checklist: Patient identified, Emergency Drugs available, Suction available and Patient being monitored Patient Re-evaluated:Patient Re-evaluated prior to induction Oxygen Delivery Method: Circle system utilized Preoxygenation: Pre-oxygenation with 100% oxygen Induction Type: IV induction Ventilation: Mask ventilation without difficulty LMA: LMA inserted LMA Size: 4.0 Number of attempts: 1 Airway Equipment and Method: Bite block Placement Confirmation: positive ETCO2 Tube secured with: Tape Dental Injury: Teeth and Oropharynx as per pre-operative assessment

## 2018-01-30 NOTE — Anesthesia Preprocedure Evaluation (Addendum)
Anesthesia Evaluation  Patient identified by MRN, date of birth, ID band Patient awake    Reviewed: Allergy & Precautions, NPO status , Patient's Chart, lab work & pertinent test results  Airway Mallampati: I  TM Distance: >3 FB Neck ROM: Full    Dental  (+) Teeth Intact, Dental Advisory Given   Pulmonary    Pulmonary exam normal        Cardiovascular hypertension, Pt. on medications  Rhythm:Regular Rate:Normal     Neuro/Psych  Headaches, negative psych ROS   GI/Hepatic Neg liver ROS, GERD  Medicated,  Endo/Other  negative endocrine ROS  Renal/GU negative Renal ROS     Musculoskeletal negative musculoskeletal ROS (+)   Abdominal Normal abdominal exam  (+)   Peds  Hematology   Anesthesia Other Findings   Reproductive/Obstetrics                            Lab Results  Component Value Date   WBC 4.4 11/16/2015   HGB 13.2 11/16/2015   HCT 39.4 11/16/2015   MCV 88.1 11/16/2015   PLT 238 11/16/2015   Lab Results  Component Value Date   CREATININE 0.72 01/27/2018   BUN 10 01/27/2018   NA 136 01/27/2018   K 4.2 01/27/2018   CL 103 01/27/2018   CO2 27 01/27/2018   No results found for: INR, PROTIME  EKG: normal sinus rhythm.   Anesthesia Physical Anesthesia Plan  ASA: II  Anesthesia Plan: General   Post-op Pain Management:    Induction: Intravenous  PONV Risk Score and Plan: 4 or greater and Ondansetron, Dexamethasone, Midazolam and Scopolamine patch - Pre-op  Airway Management Planned: LMA  Additional Equipment: None  Intra-op Plan:   Post-operative Plan: Extubation in OR  Informed Consent: I have reviewed the patients History and Physical, chart, labs and discussed the procedure including the risks, benefits and alternatives for the proposed anesthesia with the patient or authorized representative who has indicated his/her understanding and acceptance.   Dental  advisory given  Plan Discussed with: CRNA  Anesthesia Plan Comments:        Anesthesia Quick Evaluation

## 2018-01-30 NOTE — Op Note (Signed)
01/30/2018  1:25 PM  PATIENT:  Christie Stone  47 y.o. female  PRE-OPERATIVE DIAGNOSIS:  LEFT BREAST ATYPICAL DUCTAL HYPERPLASIA  POST-OPERATIVE DIAGNOSIS:  LEFT BREAST ATYPICAL DUCTAL HYPERPLASIA  PROCEDURE:  Procedure(s): LEFT BREAST LUMPECTOMY WITH RADIOACTIVE SEED LOCALIZATION (Left)  SURGEON:  Surgeon(s) and Role:    * Griselda Mineroth,  III, MD - Primary  PHYSICIAN ASSISTANT:   ASSISTANTS: none   ANESTHESIA:   local and general  EBL:  minimal   BLOOD ADMINISTERED:none  DRAINS: none   LOCAL MEDICATIONS USED:  MARCAINE     SPECIMEN:  Source of Specimen:  left breast tissue  DISPOSITION OF SPECIMEN:  PATHOLOGY  COUNTS:  YES  TOURNIQUET:  * No tourniquets in log *  DICTATION: .Dragon Dictation   After informed consent was obtained the patient was brought to the operating room and placed in the supine position on the operating table.  After adequate induction of general anesthesia the patient's left breast was prepped with ChloraPrep, allowed to dry, and draped in usual sterile manner.  An appropriate timeout was performed.  The neoprobe was set to I-125.  Previously an I-125 seed was placed in the lateral aspect of the left breast to mark an area of atypical duct hyperplasia.  The area of radioactivity was readily identified in the lateral left breast.  The area around this was infiltrated with quarter percent Marcaine.  I made a curvilinear incision along the very outer aspect of the left breast with a 15 blade knife.  The incision was carried through the skin and subcutaneous tissue sharply with electrocautery.  The dissection was then carried along the posterior portion of the breasts where it joins the chest wall towards the area of the radioactive seed.  Once this dissection was carried well beyond the radioactive seed a wedge of tissue from that quadrant was removed sharply with the electrocautery around the radioactive seed.  Once the specimen was removed it was oriented with  the appropriate paint colors.  A specimen radiograph was obtained that showed the clip and seed to be near the center of the specimen.  The specimen was then sent to pathology for further evaluation.  Hemostasis was achieved using the Bovie electrocautery.  The breast tissue in that quadrant was then not reapproximated with interrupted layers of 3-0 Vicryl stitches.  The skin was then closed with a running 4-0 Monocryl subcuticular stitch.  Dermabond dressings were applied.  The patient tolerated the procedure well.  At the end of the case all needle sponge and instrument counts were correct.  The patient was then awakened and taken to recovery in stable condition.  PLAN OF CARE: Discharge to home after PACU  PATIENT DISPOSITION:  PACU - hemodynamically stable.   Delay start of Pharmacological VTE agent (>24hrs) due to surgical blood loss or risk of bleeding: not applicable

## 2018-01-30 NOTE — Anesthesia Postprocedure Evaluation (Signed)
Anesthesia Post Note  Patient: Christie Stone  Procedure(s) Performed: LEFT BREAST LUMPECTOMY WITH RADIOACTIVE SEED LOCALIZATION (Left Breast)     Patient location during evaluation: PACU Anesthesia Type: General Level of consciousness: awake and alert Pain management: pain level controlled Vital Signs Assessment: post-procedure vital signs reviewed and stable Respiratory status: spontaneous breathing, nonlabored ventilation, respiratory function stable and patient connected to nasal cannula oxygen Cardiovascular status: blood pressure returned to baseline and stable Postop Assessment: no apparent nausea or vomiting Anesthetic complications: no    Last Vitals:  Vitals:   01/30/18 1445 01/30/18 1502  BP: 123/82 (!) 141/80  Pulse: 67 76  Resp: 17 18  Temp:  36.5 C  SpO2: 100% 100%    Last Pain:  Vitals:   01/30/18 1502  TempSrc:   PainSc: 5                  Shelton SilvasKevin D Bryttani Blew

## 2018-01-30 NOTE — Discharge Instructions (Signed)

## 2018-02-02 ENCOUNTER — Encounter (HOSPITAL_BASED_OUTPATIENT_CLINIC_OR_DEPARTMENT_OTHER): Payer: Self-pay | Admitting: General Surgery

## 2018-06-03 ENCOUNTER — Telehealth (HOSPITAL_COMMUNITY): Payer: Self-pay | Admitting: *Deleted

## 2018-06-03 NOTE — Telephone Encounter (Signed)
Patient faxed three bills to me today. Called patient with Spanish interpreter Nira Conn. Explained to patient that the bill from [employer] is covered by [employer]. Explained to patient that [facility] will cover $325 of the Anesthesia bill and that I will take care of it. Explained to patient that the [facility] bill is not covered by [facility]. Told patient that she can complete the financial assistance paperwork for the [facility] bill. Helped patient complete the financial assistance application. Patient verbalized understanding.

## 2018-11-26 ENCOUNTER — Other Ambulatory Visit: Payer: Self-pay | Admitting: Medical

## 2019-08-31 NOTE — Patient Instructions (Addendum)
It was apleasure meeting you today.  You were seen to establish care.   Dana Allan, MD Family Medicine Residency    Constipation, Adult Constipation is when a person:  Poops (has a bowel movement) fewer times in a week than normal.  Has a hard time pooping.  Has poop that is dry, hard, or bigger than normal. Follow these instructions at home: Eating and drinking   Eat foods that have a lot of fiber, such as: ? Fresh fruits and vegetables. ? Whole grains. ? Beans.  Eat less of foods that are high in fat, low in fiber, or overly processed, such as: ? [place] fries. ? Hamburgers. ? Cookies. ? Candy. ? Soda.  Drink enough fluid to keep your pee (urine) clear or pale yellow. General instructions  Exercise regularly or as told by your doctor.  Go to the restroom when you feel like you need to poop. Do not hold it in.  Take over-the-counter and prescription medicines only as told by your doctor. These include any fiber supplements.  Do pelvic floor retraining exercises, such as: ? Doing deep breathing while relaxing your lower belly (abdomen). ? Relaxing your pelvic floor while pooping.  Watch your condition for any changes.  Keep all follow-up visits as told by your doctor. This is important. Contact a doctor if:  You have pain that gets worse.  You have a fever.  You have not pooped for 4 days.  You throw up (vomit).  You are not hungry.  You lose weight.  You are bleeding from the anus.  You have thin, pencil-like poop (stool). Get help right away if:  You have a fever, and your symptoms suddenly get worse.  You leak poop or have blood in your poop.  Your belly feels hard or bigger than normal (is bloated).  You have very bad belly pain.  You feel dizzy or you faint. This information is not intended to replace advice given to you by your health care provider. Make sure you discuss any questions you have with your health care provider. Document  Revised: 02/14/2017 Document Reviewed: 08/23/2015 Elsevier Patient Education  2020 [employer].

## 2019-08-31 NOTE — Progress Notes (Signed)
    SUBJECTIVE:   CHIEF COMPLAINT / HPI: To establish care.  History obtained with live interpreter. No concerns today.  PERTINENT  PMH / PSH:  PmHX: HTN, Uterine Fibroids [id number] (2006) for fibroids, Lt Breast Lumpectomy (2019) OBHx:G2P2  FmHX: HTN in both parents and some siblings SHx: Denies tobacco use, Etoh- social, last 2 weeks ago NKA Medications: HCTZ 12.5 mg, Captopril 25 mg  OBJECTIVE:   BP 112/82   Pulse 67   Wt 149 lb (67.6 kg)   SpO2 95%   BMI 25.58 kg/m   General: Alert and oriented, no apparent distress  Eyes: PEERLA ENTM: No pharyengeal erythema Neck: nontender Cardiovascular: RRR with no murmurs noted Respiratory: CTA bilaterally  Gastrointestinal: Bowel sounds present. No abdominal pain MSK: Upper extremity strength 5/5 bilaterally, Lower extremity strength 5/5 bilaterally  Derm: No rashes noted Neuro: motor and sensory intact, gait normal, CNIII-XIII itact Psych: Behavior and speech appropriate to situation    ASSESSMENT/PLAN:   Establishing care with new doctor, encounter for Health maintanence  -Refer to GI for colonoscopy -Bmet -HbA1c -Lipid panel -HIV and Hep C labs -Follow up as needed     Dana Allan, MD [facility]

## 2019-09-01 ENCOUNTER — Other Ambulatory Visit: Payer: Self-pay

## 2019-09-01 ENCOUNTER — Ambulatory Visit (INDEPENDENT_AMBULATORY_CARE_PROVIDER_SITE_OTHER): Payer: 59 | Admitting: Family Medicine

## 2019-09-01 ENCOUNTER — Encounter: Payer: Self-pay | Admitting: Family Medicine

## 2019-09-01 VITALS — BP 112/82 | HR 67 | Wt 149.0 lb

## 2019-09-01 DIAGNOSIS — Z7689 Persons encountering health services in other specified circumstances: Secondary | ICD-10-CM

## 2019-09-01 DIAGNOSIS — Z0000 Encounter for general adult medical examination without abnormal findings: Secondary | ICD-10-CM | POA: Diagnosis not present

## 2019-09-01 DIAGNOSIS — I10 Essential (primary) hypertension: Secondary | ICD-10-CM

## 2019-09-01 DIAGNOSIS — N390 Urinary tract infection, site not specified: Secondary | ICD-10-CM | POA: Diagnosis not present

## 2019-09-01 LAB — POCT GLYCOSYLATED HEMOGLOBIN (HGB A1C): Hemoglobin A1C: 5.3 % (ref 4.0–5.6)

## 2019-09-01 MED ORDER — POLYETHYLENE GLYCOL 3350 17 GM/SCOOP PO POWD: 17.0000 g | Freq: Two times a day (BID) | ORAL | 1 refills | Status: DC | PRN

## 2019-09-02 DIAGNOSIS — Z7689 Persons encountering health services in other specified circumstances: Secondary | ICD-10-CM | POA: Insufficient documentation

## 2019-09-02 DIAGNOSIS — I10 Essential (primary) hypertension: Secondary | ICD-10-CM | POA: Insufficient documentation

## 2019-09-02 NOTE — Assessment & Plan Note (Signed)
Health maintanence  -Refer to GI for colonoscopy -Bmet -HbA1c -Lipid panel -HIV and Hep C labs -Follow up as needed

## 2019-09-03 ENCOUNTER — Ambulatory Visit: Payer: No Typology Code available for payment source | Admitting: Family Medicine

## 2019-09-03 ENCOUNTER — Encounter: Payer: Self-pay | Admitting: Family Medicine

## 2019-09-03 LAB — LIPID PANEL
Chol/HDL Ratio: 3.9 ratio (ref 0.0–4.4)
Cholesterol, Total: 199 mg/dL (ref 100–199)
HDL: 51 mg/dL (ref >39)
LDL Chol Calc (NIH): 131 mg/dL — ABNORMAL HIGH (ref 0–99)
Triglycerides: 94 mg/dL (ref 0–149)
VLDL Cholesterol Cal: 17 mg/dL (ref 5–40)

## 2019-09-03 LAB — HCV INTERPRETATION

## 2019-09-03 LAB — HCV AB W REFLEX TO QUANT PCR: HCV Ab: <0.1 s/co ratio (ref 0.0–0.9)

## 2019-09-16 ENCOUNTER — Telehealth: Payer: Self-pay | Admitting: Family Medicine

## 2019-09-16 NOTE — Telephone Encounter (Signed)
Patient came in office wanting to know about her results. Please contact patient. Thanks

## 2019-09-17 ENCOUNTER — Encounter: Payer: Self-pay | Admitting: Family Medicine

## 2019-09-17 NOTE — Telephone Encounter (Signed)
Please let her know that her labs were good.  Her bad cholesterol is a little high and she can help decrease this with exercise and diet.  We can recheck in 3-6 months.  If she has any questions please book an appointment to discuss.  Dana Allan, MD Family Medicine Residency

## 2019-09-22 NOTE — Telephone Encounter (Signed)
LVM using [facility] #[id number] asking pt to return call to inform her of below.  Will also send a mychart message with below information.Marland Kitchen Lion Fernandez Zimmerman Rumple, CMA

## 2020-07-12 ENCOUNTER — Other Ambulatory Visit: Payer: Self-pay

## 2020-07-12 ENCOUNTER — Telehealth (HOSPITAL_COMMUNITY): Payer: Self-pay | Admitting: Emergency Medicine

## 2020-07-12 ENCOUNTER — Encounter (HOSPITAL_COMMUNITY): Payer: Self-pay

## 2020-07-12 ENCOUNTER — Ambulatory Visit (HOSPITAL_COMMUNITY)
Admission: EM | Admit: 2020-07-12 | Discharge: 2020-07-12 | Disposition: A | Discharge status: Home / Self Care | Payer: 59 | Attending: Physician Assistant | Admitting: Physician Assistant

## 2020-07-12 DIAGNOSIS — R1030 Lower abdominal pain, unspecified: Secondary | ICD-10-CM | POA: Diagnosis not present

## 2020-07-12 DIAGNOSIS — M545 Low back pain, unspecified: Secondary | ICD-10-CM | POA: Diagnosis not present

## 2020-07-12 DIAGNOSIS — R8290 Unspecified abnormal findings in urine: Secondary | ICD-10-CM | POA: Insufficient documentation

## 2020-07-12 DIAGNOSIS — R350 Frequency of micturition: Secondary | ICD-10-CM | POA: Insufficient documentation

## 2020-07-12 LAB — POCT URINALYSIS DIPSTICK, ED / UC
Bilirubin Urine: NEGATIVE
Glucose, UA: NEGATIVE mg/dL
Hgb urine dipstick: NEGATIVE
Ketones, ur: NEGATIVE mg/dL
Nitrite: NEGATIVE
Protein, ur: NEGATIVE mg/dL
Specific Gravity, Urine: <=1.005 (ref 1.005–1.030)
Urobilinogen, UA: 0.2 mg/dL (ref 0.0–1.0)
pH: 7 (ref 5.0–8.0)

## 2020-07-12 MED ORDER — NITROFURANTOIN MONOHYD MACRO 100 MG PO CAPS: 100.0000 mg | ORAL_CAPSULE | Freq: Two times a day (BID) | ORAL | 0 refills | Status: DC

## 2020-07-12 NOTE — ED Provider Notes (Signed)
[facility]    CSN: [id number] Arrival date & time: 07/12/20  1839      History   Chief Complaint Chief Complaint  Patient presents with  . Back Pain  . Abdominal Pain    HPI Christie Stone is a 50 y.o. female.   Patient Spanish-speaking interpreter is utilized during this visit.  Reports a 4-day history of lower abdominal pain.  Reports associated lower back pain but denies injury or increase in activity prior to symptom onset.  Pain is rated 5 on a 0-10 pain scale, described as aching, worse with micturition, no alleviating factors identified.  She has tried cranberry pills without improvement.  She reports episodes of similar symptoms in the past that resolved with antibiotic treatment.  Denies history of recurrent UTI, nephrolithiasis, self-catheterization, single kidney.  Denies any recent urogenital procedure.  Denies associated fever, abdominal pain, nausea, vomiting, hematuria, flank pain, vaginal symptoms.     Past Medical History:  Diagnosis Date  . Anemia   . GERD (gastroesophageal reflux disease)   . H. pylori infection   . Headache   . Hypertension   . Left breast mass     Patient Active Problem List   Diagnosis Date Noted  . HTN (hypertension) 09/02/2019  . Establishing care with new doctor, encounter for 09/02/2019  . Carpal tunnel syndrome of right wrist 11/16/2015  . Breast cyst 11/07/2014  . History of Helicobacter pylori infection 06/21/2014  . Epigastric pain 01/24/2014  . Esophageal reflux 01/24/2014  . Nocturia 11/02/2013  . OAB (overactive bladder) 11/02/2013  . Vaginitis and vulvovaginitis 11/02/2013    Past Surgical History:  Procedure Laterality Date  . ABDOMINAL HYSTERECTOMY  Pt states 10 years ago.  Marland Kitchen BREAST LUMPECTOMY WITH RADIOACTIVE SEED LOCALIZATION Left 01/30/2018   Procedure: LEFT BREAST LUMPECTOMY WITH RADIOACTIVE SEED LOCALIZATION;  Surgeon: Griselda Miner, MD;  Location: [facility];  Service: General;   Laterality: Left;  . CESAREAN SECTION     2 previous  . hemorrhoidectomy      OB History    Gravida  2   Para  2   Term      Preterm      AB      Living  2     SAB      IAB      Ectopic      Multiple      Live Births  2            Home Medications    Prior to Admission medications   Medication Sig Start Date End Date Taking? Authorizing Provider  nitrofurantoin, macrocrystal-monohydrate, (MACROBID) 100 MG capsule Take 1 capsule (100 mg total) by mouth 2 (two) times daily. 07/12/20  Yes Theodore Rahrig K, PA-C  Cranberry 500 MG TABS Take by mouth.    [provider]  hydrochlorothiazide (MICROZIDE) 12.5 MG capsule Take 12.5 mg by mouth daily.    [provider]  HYDROcodone-acetaminophen (NORCO/VICODIN) 5-325 MG tablet Take 1-2 tablets by mouth every 6 (six) hours as needed for moderate pain or severe pain. 01/30/18   Chevis Pretty III, MD  pantoprazole (PROTONIX) 20 MG tablet Take 20 mg by mouth daily.    [provider]  polyethylene glycol powder (GLYCOLAX/MIRALAX) 17 GM/SCOOP powder Take 17 g by mouth 2 (two) times daily as needed. 09/01/19   Dana Allan, MD    Family History Family History  Problem Relation Age of Onset  . Hypertension Mother   .  Hypertension Father   . Stroke Father   . Colon cancer Neg Hx     Social History Social History   Tobacco Use  . Smoking status: Never Smoker  . Smokeless tobacco: Never Used  Vaping Use  . Vaping Use: Never used  Substance Use Topics  . Alcohol use: Yes    Alcohol/week: 0.0 standard drinks    Comment: rarely  . Drug use: No     Allergies   Patient has no known allergies.   Review of Systems Review of Systems  Constitutional: Negative for activity change, appetite change, fatigue and fever.  Respiratory: Negative for cough and shortness of breath.   Cardiovascular: Negative for chest pain.  Gastrointestinal: Positive for abdominal pain. Negative for diarrhea, nausea and  vomiting.  Musculoskeletal: Positive for back pain. Negative for arthralgias and myalgias.  Neurological: Negative for dizziness, light-headedness and headaches.     Physical Exam Triage Vital Signs ED Triage Vitals  Enc Vitals Group     BP 07/12/20 1912 (!) 151/94     Pulse Rate 07/12/20 1912 72     Resp 07/12/20 1912 18     Temp 07/12/20 1912 97.7 F (36.5 C)     Temp Source 07/12/20 1912 Oral     SpO2 07/12/20 1912 100 %     Weight --      Height --      Head Circumference --      Peak Flow --      Pain Score 07/12/20 1908 5     Pain Loc --      Pain Edu? --      Excl. in GC? --    No data found.  Updated Vital Signs BP (!) 151/94 (BP Location: Right Arm)   Pulse 72   Temp 97.7 F (36.5 C) (Oral)   Resp 18   SpO2 100%   Visual Acuity Right Eye Distance:   Left Eye Distance:   Bilateral Distance:    Right Eye Near:   Left Eye Near:    Bilateral Near:     Physical Exam Vitals reviewed.  Constitutional:      General: She is awake. She is not in acute distress.    Appearance: Normal appearance. She is not ill-appearing.     Comments: Very pleasant female appears stated age in no acute distress  HENT:     Head: Normocephalic and atraumatic.  Cardiovascular:     Rate and Rhythm: Normal rate and regular rhythm.     Heart sounds: No murmur heard.   Pulmonary:     Effort: Pulmonary effort is normal.     Breath sounds: Normal breath sounds. No wheezing, rhonchi or rales.     Comments: Clear to auscultation bilaterally Abdominal:     General: Bowel sounds are normal.     Palpations: Abdomen is soft.     Tenderness: There is no abdominal tenderness. There is no right CVA tenderness, left CVA tenderness, guarding or rebound.     Comments: Benign abdominal exam. no CVA tenderness.  Musculoskeletal:     Cervical back: No tenderness or bony tenderness.     Thoracic back: No tenderness or bony tenderness.     Lumbar back: No tenderness or bony tenderness.   Psychiatric:        Behavior: Behavior is cooperative.      UC Treatments / Results  Labs (all labs ordered are listed, but only abnormal results are displayed) Labs Reviewed  POCT URINALYSIS  DIPSTICK, ED / UC - Abnormal; Notable for the following components:      Result Value   Leukocytes,Ua SMALL (*)    All other components within normal limits  URINE CULTURE    EKG   Radiology No results found.  Procedures Procedures (including critical care time)  Medications Ordered in UC Medications - No data to display  Initial Impression / Assessment and Plan / UC Course  I have reviewed the triage vital signs and the nursing notes.  Pertinent labs & imaging results that were available during my care of the patient were reviewed by me and considered in my medical decision making (see chart for details).     UA showed trace leukocyte estrace.  Given clinical presentation will treat with Macrobid.  Urine culture obtained today-results pending.  Discussed potential need to change antibiotics based on urine culture results.  Recommended she use over-the-counter medications for additional symptom relief.  She is to rest and drink plenty of fluid until symptoms resolve.  Strict return precautions given to which patient expressed understanding.  Final Clinical Impressions(s) / UC Diagnoses   Final diagnoses:  Lower abdominal pain  Acute bilateral low back pain without sciatica  Urinary frequency  Abnormal urinalysis     Discharge Instructions     Take antibiotic twice daily for 5 days.  If we need to change her antibiotic based on the urine culture we will contact you.  You can take Tylenol and ibuprofen for pain relief.  Make sure you are drinking plenty of fluid.  If you have any worsening symptoms such as fever, blood in your urine, nausea/vomiting preventing you from eating or drinking you need to be reevaluated.    ED Prescriptions    Medication Sig Dispense Auth.  Provider   nitrofurantoin, macrocrystal-monohydrate, (MACROBID) 100 MG capsule Take 1 capsule (100 mg total) by mouth 2 (two) times daily. 10 capsule Margi Edmundson, Noberto Retort, PA-C     PDMP not reviewed this encounter.   Jeani Hawking, PA-C 07/12/20 1947

## 2020-07-12 NOTE — ED Triage Notes (Signed)
Pt c/o lower back pain and lower abdominal pain x 4 days. Pt states she feels a burning sensation when urinating.

## 2020-07-12 NOTE — Discharge Instructions (Addendum)
Take antibiotic twice daily for 5 days.  If we need to change her antibiotic based on the urine culture we will contact you.  You can take Tylenol and ibuprofen for pain relief.  Make sure you are drinking plenty of fluid.  If you have any worsening symptoms such as fever, blood in your urine, nausea/vomiting preventing you from eating or drinking you need to be reevaluated.

## 2020-07-14 LAB — URINE CULTURE

## 2020-07-17 ENCOUNTER — Other Ambulatory Visit: Payer: Self-pay

## 2020-07-17 ENCOUNTER — Ambulatory Visit (INDEPENDENT_AMBULATORY_CARE_PROVIDER_SITE_OTHER): Payer: 59 | Admitting: Family Medicine

## 2020-07-17 ENCOUNTER — Encounter: Payer: Self-pay | Admitting: Family Medicine

## 2020-07-17 VITALS — BP 141/96 | HR 66 | Ht 64.0 in | Wt 154.8 lb

## 2020-07-17 DIAGNOSIS — I10 Essential (primary) hypertension: Secondary | ICD-10-CM | POA: Diagnosis not present

## 2020-07-17 DIAGNOSIS — Z23 Encounter for immunization: Secondary | ICD-10-CM

## 2020-07-17 DIAGNOSIS — Z1231 Encounter for screening mammogram for malignant neoplasm of breast: Secondary | ICD-10-CM

## 2020-07-17 DIAGNOSIS — Z0000 Encounter for general adult medical examination without abnormal findings: Secondary | ICD-10-CM | POA: Insufficient documentation

## 2020-07-17 MED ORDER — HYDROCHLOROTHIAZIDE 12.5 MG PO CAPS: 12.5000 mg | ORAL_CAPSULE | Freq: Every day | ORAL | 0 refills | Status: DC

## 2020-07-17 MED ORDER — CAPTOPRIL 25 MG PO TABS: 25.0000 mg | ORAL_TABLET | Freq: Three times a day (TID) | ORAL | 0 refills | Status: DC

## 2020-07-17 NOTE — Assessment & Plan Note (Addendum)
BP 141/96 today, slightly elevated in setting of being out of her HCTZ for the last week.  Usually her Bps at home are 120/80. Refilled captopril and hctz. No medications changes made today. Obtained Bmp today.

## 2020-07-17 NOTE — Addendum Note (Signed)
Addended by: Cleatrice Burke A on: 07/17/2020 03:57 PM   Modules accepted: Orders

## 2020-07-17 NOTE — Progress Notes (Signed)
     SUBJECTIVE:   CHIEF COMPLAINT / HPI:   Christie Stone is a 50 y.o. female presents for meds refill   Hypertension Patient's current antihypertensive  medications include: captopril and hctz. Ran out of hczt 1 week ago. Compliant with medications and tolerating well without side effects.  Checking BP at home with readings in 120/80. Denies any SOB, CP, vision changes, LE edema, medication SEs, or symptoms of hypotension.   Most recent creatinine trend:  Lab Results  Component Value Date   CREATININE 0.72 01/27/2018   CREATININE 0.69 11/16/2015   CREATININE 0.63 11/11/2014     Patient has not had a BMP in the past 1 year.   Health Maintenance Due  Topic  . HIV Screening   . TETANUS/TDAP   . COLONOSCOPY (Pts 45-78yrs Insurance coverage will need to be confirmed)   . MAMMOGRAM       PERTINENT  PMH / PSH: HTN   OBJECTIVE:   BP (!) 141/96   Pulse 66   Ht 5\' 4"  (1.626 m)   Wt 154 lb 12.8 oz (70.2 kg)   SpO2 99%   BMI 26.57 kg/m    General: Alert, no acute distress Cardio: well perfused  Pulm:  normal work of breathing.  Neuro: Cranial nerves grossly intact   ASSESSMENT/PLAN:   HTN (hypertension) BP 141/96 today, slightly elevated in setting of being out of her HCTZ for the last week.  Usually her Bps at home are 120/80. Refilled captopril and hctz. No medications changes made today. Obtained Bmp today.  Healthcare maintenance Referred for screening mammogram.      , MD PGY-2 [facility]

## 2020-07-17 NOTE — Assessment & Plan Note (Signed)
Referred for screening mammogram 

## 2020-07-17 NOTE — Patient Instructions (Addendum)
Gracias por venir a verme hoy. Fue Psychiatrist. He enviado recargas a su farmacia.   He realizado un pedido para su mamografa. Llame a [facility] al [phone] para programar su cita dentro de una semana.  Realice un seguimiento con el PCP cuando lo necesite.  Si tiene alguna pregunta o inquietud, no dude en llamar a la oficina al [phone].  Los mejores deseos,  Dr Allena Katz

## 2020-07-18 LAB — BASIC METABOLIC PANEL
BUN/Creatinine Ratio: 15 (ref 9–23)
BUN: 11 mg/dL (ref 6–24)
CO2: 22 mmol/L (ref 20–29)
Calcium: 9.5 mg/dL (ref 8.7–10.2)
Chloride: 105 mmol/L (ref 96–106)
Creatinine, Ser: 0.71 mg/dL (ref 0.57–1.00)
Glucose: 92 mg/dL (ref 65–99)
Potassium: 3.9 mmol/L (ref 3.5–5.2)
Sodium: 141 mmol/L (ref 134–144)
eGFR: 103 mL/min/{1.73_m2} (ref >59)

## 2020-08-13 NOTE — Progress Notes (Signed)
SUBJECTIVE:   CHIEF COMPLAINT / HPI: physical, knee pain and hand/finger tingling  Right hand/ finger Reports intermittent finger tip numbness if 3rd-5th digits. Gets numb at night when sleeping.  Has been ongoing for 4-5 months.  Denies any pain or weakness of extremities.  No neck pain but reports feels that sometimes neck tightness that may attribute to some tingling of fingers.  Works as a Engineer, water with repetitive motion of wrists, shoulder and neck.  Has not tried any conservative measures.  No history of trauma or recent injury.  Right hand dominance.  Left knee Larey Seat this past February and landed on knee.  Swelling at that time.  Was able to weight bear at time of injury.  Pain resolved and recently returned a few weeks ago.  Notices pain mostly after day of work.  Denies any swelling, weakness or decrease in sensation in lower extremities.  No incontinence of bowel or bladder.    UTI Recently seen in ED for UTI and was treated with Nitrofurantoin x 5 days.  Symptoms resolving but still having some lower abdominal pressure. Denies any dysuria, vaginal discharge, hematuria,or back pain.  PERTINENT  PMH / PSH:    OBJECTIVE:   BP (!) 138/100   Pulse 62   Ht 5\' 4"  (1.626 m)   Wt 151 lb 12.8 oz (68.9 kg)   SpO2 100%   BMI 26.06 kg/m    General: Alert, no acute distress Neck - No masses or thyromegaly or limitation in range of motion Shoulder: -Inspection: no obvious deformity, atrophy, or asymmetry. No bruising. No swelling -Palpation: no TTP over [facility] joint or bicipital groove. -ROM: Full ROM in abduction, flexion, internal/external rotation both passively and actively  NV intact distally  Hand/Fingers/Wrist Inspection: no swelling, no bony deformity or atrophy of the hypothenar region  Palpation: nontender to palpation of DIP, PIP, MTP, scaphoid/snuff box, scaphoid tubercle  AROM/PROM: Full flexion, extension, supination, pronation  Strength: 5/5 flexion, 5/5 extension,  5/5 pronation, 5/5 supination Special tests: (-) [facility] grind, (-) F[place]in, (-) Tinel at wrist, (-) Phalen  Cardio: Normal S1 and S2, RRR, no r/m/g Pulm: CTAB, normal work of breathing Abdomen: Bowel sounds normal. Abdomen soft and non-tender.   Knees:  Left knee WNL.No redness or swelling noted.  Good passive/active range of motion of knee without pain.  Internal and external hip rotation good without pain.  No tenderness at medial or lateral joint line.  Unable to appreciate any joint effusion.  Anterior/posterior drawer tests, McMurray's, Lachmann's testing all negative.  Patello-femoral testing negative.        ASSESSMENT/PLAN:    Carpal tunnel syndrome of right wrist Likely symptoms secondary to nerve irritation from over use.  Ulnar nerve distribution at finger tips of 3rd-5th digits of Right hand.  Also consider cervical nerve impingement given symptoms appear at night. -Wrist splints while at work and at night -Tylenol 650 mg q8h x 2 weeks -Ibuprofen 400 mg q8h x 2 weeks -Neck exercises provided -If no improvement in symptoms at next visit will order physical therapy. -Follow up appointment scheduled for 06/22   Knee pain, left Fell 4 months ago.  Now having intermittent pain at night after work.  No instability of knee.  Likely irritation from repetitive motion. -Acetaminophen 650 mg q8h x 2 weeks -Heat/Ice as needed -Knee exercises provided -Follow up scheduled for 06/22  HTN (hypertension) BP elevated today.  Taking Captopril 25 mg BID and HCTZ 12.5 mg.  Ordered to take Captopril  TID. -Monitor BP two to three times weekly, record and will review at next visit -Would like to switch to ARB if patient agreeable -Continue HCTZ 12.5 mg daily -Follow up in 2 weeks -Strict return precautions provided  UTI (urinary tract infection) Recently treated with 5 days of Nitrofurantoin. Still lower abdominal pressure -Repeat urine today, will send for cultures if needed -Follow up  in 2 weeks.  -Due for PAP and will do pelvic exam at that time   Health Maintenance  Colonoscopy ordered today PAP scheduled for 06/22 Mammogram ordered 07/17/20 Hep C screening completed 08/2019 HIV screening today COVID Vaccine readdress at next visit Pneumonia Vaccine readdress at next visit Shingle Vaccine encouraged to receive at pharmacy A1c today Lipid Panel today   Dana Allan, MD [facility]

## 2020-08-13 NOTE — Patient Instructions (Addendum)
Thank you for coming to see me today. It was a pleasure.   Your last PAP was in May 2017 and is now due to be repeated.  Please schedule an appointment for this at your earliest convenience.    Check your Blood pressure two to three times a week.  Record this and we can discuss the values at your next visit.  We will get some labs today.  If they are abnormal or we need to do something about them, I will call you.  If they are normal, I will send you a message on MyChart (if it is active) or a letter in the mail.  If you don't hear from [place] in 2 weeks, please call the office at the number below.   Please follow-up with PCP  On June 22 at 210 pm for PAP and BP check  If you have any questions or concerns, please do not hesitate to call the office at [phone].  Best,   Dana Allan, MD    Ejercicios para el cuello Neck Exercises Pregunte al mdico qu ejercicios son seguros para usted. Haga los ejercicios exactamente como se lo haya indicado el mdico y gradelos como se lo hayan indicado. Es normal sentir un estiramiento leve, tironeo, opresin o Dentist al [employer] ejercicios. Detngase de inmediato si siente un dolor repentino o Community education officer. No comience a hacer estos ejercicios hasta que se lo indique el mdico. Los ejercicios para el cuello pueden ser importantes por muchos motivos. Pueden mejorar la fuerza y Pharmacologist la flexibilidad del cuello, lo que ser til para la parte superior de la espalda y para Estate agent de cuello. Ejercicios de estiramiento Estiramiento del cuello con rotacin 1. Sintese en una silla o prese. 2. Apoye los pies en el piso, con una separacin del ancho de los hombros. 3. Gire lentamente la cabeza (rtela) hacia la derecha hasta sentir un ligero estiramiento. Grela completamente hacia la derecha de modo que pueda ver por encima de su hombro derecho. No incline ni ladee la cabeza. 4. Mantenga esta posicin durante 10 a  30segundos. 5. Gire lentamente la cabeza (rtela) hacia la izquierda hasta sentir un ligero estiramiento. Grela completamente hacia la izquierda de modo que pueda ver por encima de su hombro izquierdo. No incline ni ladee la cabeza. 6. Mantenga esta posicin durante 10 a 30segundos. Repita __________ veces. Realice este ejercicio __________ veces al da.   Retraccin del cuello 1. Sintese en una silla resistente o prese. 2. Mire hacia adelante. No doble el cuello. 3. Use los dedos para empujar la barbilla hacia atrs (retraccin). No doble el cuello al [employer]. Siga mirando hacia adelante. Si hace el ejercicio correctamente, tendr Neomia Dear leve sensacin en la garganta y sentir que la nuca se estira. 4. Mantenga la elongacin durante 1o 2segundos. Repita __________ veces. Realice este ejercicio __________ veces al da. Ejercicios de fortalecimiento Presin con el cuello 1. Recustese sobre su espalda en una cama firme o en el suelo, y coloque una almohada debajo de la cabeza. 2. Use los msculos del cuello para presionar la cabeza contra la almohada y enderezar la columna vertebral. 3. Mantenga la posicin lo mejor que pueda. Mantenga la Carolyne Fiscal arriba (en posicin neutral) y el mentn hacia abajo. 4. Cuente lentamente hasta mantiene esta posicin. Repita __________ veces. Realice este ejercicio __________ veces al da. Isometra Estos son ejercicios en los que se fortalecen los msculos del cuello mientras se mantiene el cuello  quieto (isometra). 1. Sintese en una silla con buen apoyo y coloque una mano sobre la frente. 2. Mantenga la cabeza y la cara mirando hacia adelante. No flexione o extienda el cuello mientras hace ejercicios isomtricos. 3. Empuje hacia adelante con la cabeza y el cuello mientras que con la mano ejerce cierta presin hacia el lado contrario. Mantenga esta posicin durante 10segundos. 4. Vuelva a realizar la secuencia, esta vez  poniendo la mano contra la nuca. Use la cabeza y el cuello para empujar en la direccin contraria a la presin que ejerce con la mano. 5. Para terminar, haga el mismo ejercicio en cada lado de la cabeza, empujando hacia los lados en la direccin contraria a la presin de Engineer, site. Repita __________ veces. Realice este ejercicio __________ veces al da. Levantamientos de la cabeza desde la posicin decbito prono 1. Acustese boca abajo (posicin prona) y [employer] codos de modo que el pecho y la parte superior de la espalda se eleven. 2. Comience con la cabeza mirando hacia abajo, cerca del pecho. Coloque el mentn cerca del pecho o [place]. 3. Eleve lentamente la cabeza. Hgalo hasta quedar mirando hacia adelante. Luego contine elevando y estirando la cabeza hacia atrs lo ms que pueda con comodidad. 4. Mantenga la cabeza elevada durante 5segundos. A continuacin, bjela lentamente hasta la posicin inicial. Repita __________ veces. Realice este ejercicio __________ veces al da. Levantamientos de la cabeza desde la posicin decbito supino 1. Acustese sobre la espalda (posicin decbito supino), doble las rodillas apuntando hacia el techo y [employer] pies apoyados en el piso. 2. Levante lentamente la cabeza del suelo, y eleve el mentn hacia el [place]. 3. Mantenga esta posicin durante 5segundos. Repita __________ veces. Realice este ejercicio __________ veces al da. Retraccin escapular 1. Prese con los brazos a los costados. Mire hacia adelante. 2. Lentamente, lleve los hombros (escpulas) hacia atrs y hacia abajo (retraccin) hasta sentir que la zona de los omplatos, en la parte alta de la espalda, se estira. 3. Mantenga esta posicin durante 10 a 30segundos. 4. Reljese y luego repita el ejercicio. Repita __________ veces. Realice este ejercicio __________ veces al da. Comunquese con un mdico si:  El dolor o las molestias en el cuello se vuelven mucho ms intensos cuando  hace un ejercicio.  El dolor o las molestias en el cuello no mejoran en el trmino de las 2horas posteriores a Copy. Si tiene alguno de [employer], deje de [employer] ejercicios de inmediato. No vuelva a hacer los ejercicios a menos que el mdico lo autorice. Solicite ayuda inmediatamente si:  Siente un dolor sbito e intenso en el cuello. Si esto ocurre, deje de [employer] ejercicios de inmediato. No vuelva a hacer los ejercicios a menos que el mdico lo autorice. Esta informacin no tiene Theme park manager el consejo del mdico. Asegrese de hacerle al mdico cualquier pregunta que tenga. Document Revised: 02/03/2018 Document Reviewed: 02/03/2018 Elsevier Patient Education  2021 [employer].

## 2020-08-15 ENCOUNTER — Other Ambulatory Visit: Payer: Self-pay

## 2020-08-15 ENCOUNTER — Encounter: Payer: Self-pay | Admitting: Family Medicine

## 2020-08-15 ENCOUNTER — Ambulatory Visit (INDEPENDENT_AMBULATORY_CARE_PROVIDER_SITE_OTHER): Payer: 59 | Admitting: Family Medicine

## 2020-08-15 VITALS — BP 138/100 | HR 62 | Ht 64.0 in | Wt 151.8 lb

## 2020-08-15 DIAGNOSIS — R202 Paresthesia of skin: Secondary | ICD-10-CM

## 2020-08-15 DIAGNOSIS — N390 Urinary tract infection, site not specified: Secondary | ICD-10-CM

## 2020-08-15 DIAGNOSIS — Z0000 Encounter for general adult medical examination without abnormal findings: Secondary | ICD-10-CM

## 2020-08-15 DIAGNOSIS — Z124 Encounter for screening for malignant neoplasm of cervix: Secondary | ICD-10-CM | POA: Diagnosis not present

## 2020-08-15 DIAGNOSIS — G5601 Carpal tunnel syndrome, right upper limb: Secondary | ICD-10-CM

## 2020-08-15 DIAGNOSIS — E785 Hyperlipidemia, unspecified: Secondary | ICD-10-CM

## 2020-08-15 DIAGNOSIS — Z1211 Encounter for screening for malignant neoplasm of colon: Secondary | ICD-10-CM | POA: Diagnosis not present

## 2020-08-15 DIAGNOSIS — G8929 Other chronic pain: Secondary | ICD-10-CM

## 2020-08-15 DIAGNOSIS — M25562 Pain in left knee: Secondary | ICD-10-CM

## 2020-08-15 DIAGNOSIS — I10 Essential (primary) hypertension: Secondary | ICD-10-CM

## 2020-08-15 DIAGNOSIS — Z1231 Encounter for screening mammogram for malignant neoplasm of breast: Secondary | ICD-10-CM | POA: Diagnosis not present

## 2020-08-15 LAB — POCT URINALYSIS DIP (CLINITEK)
Bilirubin, UA: NEGATIVE
Blood, UA: NEGATIVE
Glucose, UA: NEGATIVE mg/dL
Ketones, POC UA: NEGATIVE mg/dL
Leukocytes, UA: NEGATIVE
Nitrite, UA: NEGATIVE
POC PROTEIN,UA: NEGATIVE
Spec Grav, UA: 1.01 (ref 1.010–1.025)
Urobilinogen, UA: 0.2 E.U./dL
pH, UA: 6.5 (ref 5.0–8.0)

## 2020-08-15 MED ORDER — ACETAMINOPHEN ER 650 MG PO TBCR: 1300.0000 mg | EXTENDED_RELEASE_TABLET | Freq: Three times a day (TID) | ORAL | 0 refills | Status: AC

## 2020-08-16 LAB — HEMOGLOBIN A1C
Est. average glucose Bld gHb Est-mCnc: 120 mg/dL
Hgb A1c MFr Bld: 5.8 % — ABNORMAL HIGH (ref 4.8–5.6)

## 2020-08-16 LAB — LIPID PANEL
Chol/HDL Ratio: 3.8 ratio (ref 0.0–4.4)
Cholesterol, Total: 185 mg/dL (ref 100–199)
HDL: 49 mg/dL (ref >39)
LDL Chol Calc (NIH): 123 mg/dL — ABNORMAL HIGH (ref 0–99)
Triglycerides: 69 mg/dL (ref 0–149)
VLDL Cholesterol Cal: 13 mg/dL (ref 5–40)

## 2020-08-16 LAB — HIV ANTIBODY (ROUTINE TESTING W REFLEX): HIV Screen 4th Generation wRfx: NONREACTIVE

## 2020-08-21 ENCOUNTER — Encounter: Payer: Self-pay | Admitting: Family Medicine

## 2020-08-21 DIAGNOSIS — R202 Paresthesia of skin: Secondary | ICD-10-CM | POA: Insufficient documentation

## 2020-08-21 DIAGNOSIS — M25562 Pain in left knee: Secondary | ICD-10-CM | POA: Insufficient documentation

## 2020-08-21 NOTE — Assessment & Plan Note (Signed)
Likely symptoms secondary to nerve irritation from over use.  Ulnar nerve distribution at finger tips of 3rd-5th digits of Right hand.  Also consider cervical nerve impingement given symptoms appear at night. -Wrist splints while at work and at night -Tylenol 650 mg q8h x 2 weeks -Ibuprofen 400 mg q8h x 2 weeks -Neck exercises provided -If no improvement in symptoms at next visit will order physical therapy. -Follow up appointment scheduled for 06/22

## 2020-08-21 NOTE — Assessment & Plan Note (Addendum)
Recently treated with 5 days of Nitrofurantoin. Still lower abdominal pressure -Repeat urine today, will send for cultures if needed -Follow up in 2 weeks.  -Due for PAP and will do pelvic exam at that time

## 2020-08-21 NOTE — Assessment & Plan Note (Signed)
Fell 4 months ago.  Now having intermittent pain at night after work.  No instability of knee.  Likely irritation from repetitive motion. -Acetaminophen 650 mg q8h x 2 weeks -Heat/Ice as needed -Knee exercises provided -Follow up scheduled for 06/22

## 2020-08-21 NOTE — Assessment & Plan Note (Signed)
BP elevated today.  Taking Captopril 25 mg BID and HCTZ 12.5 mg.  Ordered to take Captopril TID. -Monitor BP two to three times weekly, record and will review at next visit -Would like to switch to ARB if patient agreeable -Continue HCTZ 12.5 mg daily -Follow up in 2 weeks -Strict return precautions provided

## 2020-09-03 NOTE — Patient Instructions (Signed)
Thank you for coming to see me today. It was a pleasure.   You do not need to have routine PAP smears as you no longer have a cervix and your hysterectomy was for uterine fibroids.  Continue to monitor your blood pressure  Decrease your Captopril to 12.5 mg twice a day Continue to take your HCTZ 12.5 mg daily.   Please follow-up with PCP in July 13 at 1020 am  If you have any questions or concerns, please do not hesitate to call the office at [phone].  Best,   Dana Allan, MD

## 2020-09-03 NOTE — Progress Notes (Signed)
    SUBJECTIVE:   CHIEF COMPLAINT / HPI: for PAP   Gyn concerns/Preventative healthcare Last menstrual period: No LMP recorded. Patient has had a hysterectomy. Unsure if still has cervix Sexually active: yes Hx of STD: Patient doe not desire STD screening Dyspareunia: yes Hot flashes: No Vaginal discharge: no Dysuria:No  Last mammogram: 2019,  Breast mass or concerns: No Last pap smear: 2015, normal   Hypertension Compliant with medication.  Taking Captopril 25 mg BID and HCTZ 12.5 mg daily.  Reports intermittent dizziness when standing.  Denies any chest pain, blurry vision, or shortness of breath.  PERTINENT  PMH / PSH:  HTN HLD   OBJECTIVE:   BP 134/86   Pulse 76   Wt 152 lb 2 oz (69 kg)   SpO2 97%   BMI 26.11 kg/m    General: Alert, no acute distress Cardio: Normal S1 and S2, RRR, no r/m/g Pulm: CTAB, normal work of breathing Abdomen: Bowel sounds normal. Abdomen soft and non-tender.  Extremities: No peripheral edema.  Pelvic Exam chaperoned by CMA Tashira        External: normal female genitalia without lesions or masses        Vagina: normal without lesions or masses        Cervix: no cervix visible           ASSESSMENT/PLAN:   Encounter for screening for cervical cancer Patient was unsure if had cervix after hysterectomy for reported fibroids No cervix visible on exam Cervical screening discontinued  Vaginal dryness, menopausal Reports vaginal dryness with sexual intercourse recently causing some discomfort. Trial Vaseline lubrication, if no improvement in symptoms can try estrogen cream Will revisit at next visit  HTN (hypertension) Normotensive today.  Home SBP low 100's.  Positive orthostatics today Decrease Captopril from 25 BID to 12.5 mg TID Continue HCTZ 12.5 mg Monitior BP at home and record, bring in BP machine at next visit with readings Strict return precautions provided Follow up appoint scheduled for 07/13    Health  Maintenance Colonoscopy referred 05/31 PAP completed 2015.  No longer required given TAH Mammogram ordered 05/22 Hep C screening completed 2021 HIV screening completed 2022 COVID Vaccine will readdress at next visit Shingle Vaccine, encouraged to go to pharmacy to recieve A1c- 5.8 05/31 Statin: no ASCVD 2.4%  Dana Allan, MD [facility]

## 2020-09-06 ENCOUNTER — Other Ambulatory Visit: Payer: Self-pay

## 2020-09-06 ENCOUNTER — Ambulatory Visit (INDEPENDENT_AMBULATORY_CARE_PROVIDER_SITE_OTHER): Payer: 59 | Admitting: Family Medicine

## 2020-09-06 VITALS — BP 134/86 | HR 76 | Wt 152.1 lb

## 2020-09-06 DIAGNOSIS — I10 Essential (primary) hypertension: Secondary | ICD-10-CM | POA: Diagnosis not present

## 2020-09-06 DIAGNOSIS — N951 Menopausal and female climacteric states: Secondary | ICD-10-CM

## 2020-09-06 DIAGNOSIS — Z124 Encounter for screening for malignant neoplasm of cervix: Secondary | ICD-10-CM

## 2020-09-06 MED ORDER — CAPTOPRIL 12.5 MG PO TABS: 12.5000 mg | ORAL_TABLET | Freq: Three times a day (TID) | ORAL | 0 refills | Status: DC

## 2020-09-10 ENCOUNTER — Encounter: Payer: Self-pay | Admitting: Family Medicine

## 2020-09-10 DIAGNOSIS — Z124 Encounter for screening for malignant neoplasm of cervix: Secondary | ICD-10-CM | POA: Insufficient documentation

## 2020-09-10 DIAGNOSIS — N951 Menopausal and female climacteric states: Secondary | ICD-10-CM | POA: Insufficient documentation

## 2020-09-10 NOTE — Assessment & Plan Note (Signed)
Reports vaginal dryness with sexual intercourse recently causing some discomfort. Trial Vaseline lubrication, if no improvement in symptoms can try estrogen cream Will revisit at next visit

## 2020-09-10 NOTE — Assessment & Plan Note (Addendum)
Normotensive today.  Home SBP low 100's.  Positive orthostatics today Decrease Captopril from 25 BID to 12.5 mg TID Continue HCTZ 12.5 mg Monitior BP at home and record, bring in BP machine at next visit with readings Strict return precautions provided Follow up appoint scheduled for 07/13

## 2020-09-10 NOTE — Assessment & Plan Note (Signed)
Patient was unsure if had cervix after hysterectomy for reported fibroids No cervix visible on exam Cervical screening discontinued

## 2020-09-27 ENCOUNTER — Ambulatory Visit (INDEPENDENT_AMBULATORY_CARE_PROVIDER_SITE_OTHER): Payer: 59 | Admitting: Family Medicine

## 2020-09-27 ENCOUNTER — Other Ambulatory Visit: Payer: Self-pay

## 2020-09-27 ENCOUNTER — Encounter: Payer: Self-pay | Admitting: Family Medicine

## 2020-09-27 VITALS — BP 138/92 | HR 64 | Ht 64.0 in | Wt 152.4 lb

## 2020-09-27 DIAGNOSIS — I10 Essential (primary) hypertension: Secondary | ICD-10-CM

## 2020-09-27 NOTE — Patient Instructions (Addendum)
Thank you for coming to see me today. It was a pleasure.   Stop Captopril  Continue Hydrochlorothiazide 12.5 mg once a day Continue to check your blood pressure at home 2-3 times a weeks and bring in results at next visit  Recommend getting Shingles Vaccine at your pharmacy.  You are due for a colonoscopy.  Please use the form that we have given you to schedule this at your convenience.    I have placed an order for your mammogram.  Please call [facility] at [phone] to schedule your appointment within one week.   Please follow-up with PCP in two weeks  If you have any questions or concerns, please do not hesitate to call the office at [phone].  Best,   Dana Allan, MD

## 2020-09-27 NOTE — Progress Notes (Signed)
    SUBJECTIVE:   CHIEF COMPLAINT / HPI:  follow up blood pressure  Presents for follow up for blood pressure management. Seen in clinic on 06/22 and Captopril was decreased to 12.5 mg TID from 25 mg BID.  Was having some dizziness and positive orthostatics.  Since then patient reports feeling better but still has some dizziness in the morning when getting out of bed. BP at home low 110's- 130's.   PERTINENT  PMH / PSH:  HTN  OBJECTIVE:   BP (!) 138/92   Pulse 64   Ht 5\' 4"  (1.626 m)   Wt 152 lb 6.4 oz (69.1 kg)   SpO2 100%   BMI 26.16 kg/m    General: Alert, no acute distress Cardio: Normal S1 and S2, RRR, no r/m/g Pulm: CTAB, normal work of breathing Extremities: No peripheral edema.     ASSESSMENT/PLAN:   HTN (hypertension) Today in clinic 138/92. Rechecked with patients BP machine and 138/92.  BP at home normotensive. -Discontinue Captopril -Continue HCTZ 12.5mg  -Follow up in 2 weeks. -Strict return precautions provided.     , MD [facility]

## 2020-10-01 ENCOUNTER — Encounter: Payer: Self-pay | Admitting: Family Medicine

## 2020-10-01 NOTE — Assessment & Plan Note (Signed)
Today in clinic 138/92. Rechecked with patients BP machine and 138/92.  BP at home normotensive. -Discontinue Captopril -Continue HCTZ 12.5mg  -Follow up in 2 weeks. -Strict return precautions provided.

## 2020-10-12 ENCOUNTER — Other Ambulatory Visit: Payer: Self-pay | Admitting: Family Medicine

## 2020-10-12 ENCOUNTER — Ambulatory Visit (INDEPENDENT_AMBULATORY_CARE_PROVIDER_SITE_OTHER): Payer: 59 | Admitting: Family Medicine

## 2020-10-12 ENCOUNTER — Other Ambulatory Visit: Payer: Self-pay

## 2020-10-12 ENCOUNTER — Encounter: Payer: Self-pay | Admitting: Family Medicine

## 2020-10-12 DIAGNOSIS — I10 Essential (primary) hypertension: Secondary | ICD-10-CM

## 2020-10-12 DIAGNOSIS — R519 Headache, unspecified: Secondary | ICD-10-CM | POA: Diagnosis not present

## 2020-10-12 MED ORDER — HYDROCHLOROTHIAZIDE 12.5 MG PO CAPS: ORAL_CAPSULE | ORAL | 3 refills | Status: DC

## 2020-10-12 NOTE — Patient Instructions (Signed)
It was great seeing you today.  Regarding your blood pressures the readings that you gave me look pretty good and I want you to continue your current blood pressure regimen.  I sent a refill for your medication to your pharmacy.  Regarding your headaches I would like for you to keep a headache diary so that we can try and identify a possible cause of the headaches.  It does sound like a migraine but lets try over-the-counter medication such as ibuprofen or Tylenol before he moved to other medications.  If you have any questions, worsening symptoms please feel free to call the clinic.  I hope you have a wonderful afternoon!

## 2020-10-12 NOTE — Progress Notes (Signed)
    SUBJECTIVE:   CHIEF COMPLAINT / HPI:   A translator was used for this entire interview  BP Check  Patient was recently seen in our clinic for having episodes of headaches and dizziness.  She had positive orthostatics and so some of her blood pressure medications were discontinued.  She is currently only taking hydrochlorothiazide.  Reports she has been taking her blood pressures 3 times a day and has a log with her.  Most of the blood pressures were between 110s-120s/70s.  She did have occasional 140s/90s or 130s/80s.  Has not had any issues with dizziness but continues to have issues with headaches.  Headaches  Patient reports she has headaches and episodes which will last for few weeks.  She reports that the headaches are either on one side of her head or the other side of her head.  She reports photophobia.  Usually when she is having these stretches of headaches she will have 2-3 headaches a week and then they will spontaneously resolve.  She has not been using any over-the-counter medications.  Denies any loss of sensation, loss of bowel or bladder function, weakness in upper or lower extremities, slurred speech.  Cannot identify any triggers.  Reports that her photophobia is worse at night so when she is driving her headaches get worse with the headlights. OBJECTIVE:   BP 135/89   Pulse 69   Ht 5\' 4"  (1.626 m)   Wt 151 lb 9.6 oz (68.8 kg)   SpO2 99%   BMI 26.02 kg/m   General: Well-appearing, no acute distress Cardiac: Regular rate and rhythm, no murmurs appreciated Respiratory: Normal breathing, lungs clear to auscultation bilaterally Abdomen: Soft, nontender, positive bowel sounds MSK: No gross abnormalities Neuro: Cranial nerves intact, upper and lower motor neurons intact with full strength in upper and lower extremities.  No slurred speech.  Pupils equal and reactive to light.  Extraocular eye movement intact.  ASSESSMENT/PLAN:   HTN (hypertension) Blood pressure in  clinic today 138/92.  Checked orthostatics today and negative for orthostasis.  Patient brought blood pressure diary and majority of blood pressures were 120s/70s.  There were occasional 140s/90s.  We will continue HCTZ 12.5 mg daily.  We will follow-up in 1 month and patient is going to bring headache diary.  She will continue to check her blood pressures when she feels necessary.  Strict ED and return precautions given and patient is agreeable to this.  If patient has higher range blood pressures at the next visit she may need additional blood pressure medications but will let PCP make this decision at that time.  Headache Patient reports periods of time where she will have multiple headaches a week for several weeks.  She usually has 2-3 headaches per week.  Reports that these headaches are one-sided.  She has photophobia associated with these headaches.  Most consistent with a migraine.  Has not tried any over-the-counter medications.  Denies any dizziness, slurred speech, weakness in upper or lower extremities.  Discussed use of over-the-counter medications and patient is going to keep a headache diary.  She is going to follow-up in 1- 2 months for discussion on if she needs further treatment for this.  Strict ED and return precautions given.     , MD [facility]

## 2020-10-13 DIAGNOSIS — R519 Headache, unspecified: Secondary | ICD-10-CM | POA: Insufficient documentation

## 2020-10-13 NOTE — Assessment & Plan Note (Signed)
Blood pressure in clinic today 138/92.  Checked orthostatics today and negative for orthostasis.  Patient brought blood pressure diary and majority of blood pressures were 120s/70s.  There were occasional 140s/90s.  We will continue HCTZ 12.5 mg daily.  We will follow-up in 1 month and patient is going to bring headache diary.  She will continue to check her blood pressures when she feels necessary.  Strict ED and return precautions given and patient is agreeable to this.  If patient has higher range blood pressures at the next visit she may need additional blood pressure medications but will let PCP make this decision at that time.

## 2020-10-13 NOTE — Assessment & Plan Note (Signed)
Patient reports periods of time where she will have multiple headaches a week for several weeks.  She usually has 2-3 headaches per week.  Reports that these headaches are one-sided.  She has photophobia associated with these headaches.  Most consistent with a migraine.  Has not tried any over-the-counter medications.  Denies any dizziness, slurred speech, weakness in upper or lower extremities.  Discussed use of over-the-counter medications and patient is going to keep a headache diary.  She is going to follow-up in 1- 2 months for discussion on if she needs further treatment for this.  Strict ED and return precautions given.

## 2020-12-27 ENCOUNTER — Encounter: Payer: Self-pay | Admitting: Family Medicine

## 2021-01-05 ENCOUNTER — Ambulatory Visit (INDEPENDENT_AMBULATORY_CARE_PROVIDER_SITE_OTHER): Payer: 59 | Admitting: Family Medicine

## 2021-01-05 ENCOUNTER — Telehealth: Payer: Self-pay | Admitting: Family Medicine

## 2021-01-05 ENCOUNTER — Other Ambulatory Visit: Payer: Self-pay

## 2021-01-05 VITALS — BP 122/90 | HR 74 | Ht 64.0 in | Wt 148.5 lb

## 2021-01-05 DIAGNOSIS — Z23 Encounter for immunization: Secondary | ICD-10-CM | POA: Diagnosis not present

## 2021-01-05 DIAGNOSIS — Z1211 Encounter for screening for malignant neoplasm of colon: Secondary | ICD-10-CM

## 2021-01-05 DIAGNOSIS — R1084 Generalized abdominal pain: Secondary | ICD-10-CM

## 2021-01-05 MED ORDER — HYDROCHLOROTHIAZIDE 12.5 MG PO CAPS: ORAL_CAPSULE | ORAL | 3 refills | Status: DC

## 2021-01-05 NOTE — Progress Notes (Addendum)
    SUBJECTIVE:   CHIEF COMPLAINT / HPI: Upset stomach  Spanish video interpretor used  Ongoing for 3 years but had a flare-up 3 weeks ago which improved but came back 2 days ago. Pain was mild this morning, none currently. She reports associated nausea and nervousness. Has regular BM daily, soft stools with flare-ups. Feels like this after eating out sometimes. Denies fever, blood in stool, vomiting, dysuria, urinary frequency.  She has had a hysterectomy and no longer has menstrual periods. Recently had Covid 1 week ago with mild symptoms including sore throat which resolved after 1 day. Still having some fatigue.  PERTINENT  PMH / PSH: HTN, esophageal reflux, history of H. Pylori, abdominal hysterectomy  OBJECTIVE:   BP 122/90   Pulse 74   Ht 5\' 4"  (1.626 m)   Wt 148 lb 8 oz (67.4 kg)   SpO2 99%   BMI 25.49 kg/m   General: Middle-age female, NAD CV: RRR, no murmurs Pulm: CTAB, no wheezes or rales Abdomen: Soft, nontender, positive bowel sounds  Wt Readings from Last 3 Encounters:  01/05/21 148 lb 8 oz (67.4 kg)  10/12/20 151 lb 9.6 oz (68.8 kg)  09/27/20 152 lb 6.4 oz (69.1 kg)    Depression screen PHQ 2/9 01/05/2021  Decreased Interest 2  Down, Depressed, Hopeless 2  PHQ - 2 Score 4  Altered sleeping 1  Tired, decreased energy 2  Change in appetite 1  Feeling bad or failure about yourself  1  Trouble concentrating 1  Moving slowly or fidgety/restless 0  Suicidal thoughts 0  PHQ-9 Score 10  Difficult doing work/chores -     ASSESSMENT/PLAN:   Abdominal pain Intermittent, mild and is asymptomatic at this time.  Possibly related to food she eats.  No concerning signs and abdominal exam is benign.  Will obtain basic labs today with CBC and CMP.  Also referral to GI for screening colonoscopy.   Elevated PHQ-9 Score of 10. No suicidality. Patient expresses recent stress as she and her family recently contracted Covid. She and family are doing well. Will continue to  monitor.  HCM - colonoscopy due, referral ordered - Flu vaccine given today   01/07/2021, MD [facility]

## 2021-01-05 NOTE — Patient Instructions (Addendum)
It was nice seeing you today!  I will send you a letter if your labs look normal.  Referral placed for colonoscopy. You should expect a call for an appointment. Call if you haven't heard back in 2 weeks.  Follow-up in 1 month.  Please arrive at least 15 minutes prior to your scheduled appointments.  --  Fue agradable verte hoy!  Le enviar una carta si sus laboratorios se ven normales.  Derivacin colocada para colonoscopia. Debe esperar una llamada para una cita. Llame si no ha recibido respuesta en 2 semanas.  Seguimiento en 1 mes.  Llegue al menos 15 minutos antes de sus citas programadas.  Stay well, Littie Deeds, MD [facility] [phone]

## 2021-01-05 NOTE — Telephone Encounter (Signed)
Attempted to call patient with Spanish interpreter. Was unable to reach patient at this time to discuss medication issue.   Veronda Prude, RN

## 2021-01-05 NOTE — Telephone Encounter (Signed)
Patient walked in to request refill of:  Name of Medication(s):  Hydrochlorothiazide 12.5 mg cp  Last date of OV:  01/05/21 Pharmacy:  [facility]  Will route refill request to Clinic RN.  Discussed with patient policy to call pharmacy for future refills.  Also, discussed refills may take up to 48 hours to approve or deny.  [facility]   Prescription was sent to [facility] and pharmacy refused to fill need [facility] to [facility]. Pls call pt.

## 2021-01-06 LAB — COMPREHENSIVE METABOLIC PANEL
ALT: 21 IU/L (ref 0–32)
AST: 24 IU/L (ref 0–40)
Albumin/Globulin Ratio: 1.8 (ref 1.2–2.2)
Albumin: 4.8 g/dL (ref 3.8–4.9)
Alkaline Phosphatase: 78 IU/L (ref 44–121)
BUN/Creatinine Ratio: 16 (ref 9–23)
BUN: 12 mg/dL (ref 6–24)
Bilirubin Total: 0.6 mg/dL (ref 0.0–1.2)
CO2: 24 mmol/L (ref 20–29)
Calcium: 10 mg/dL (ref 8.7–10.2)
Chloride: 99 mmol/L (ref 96–106)
Creatinine, Ser: 0.75 mg/dL (ref 0.57–1.00)
Globulin, Total: 2.6 g/dL (ref 1.5–4.5)
Glucose: 85 mg/dL (ref 70–99)
Potassium: 4 mmol/L (ref 3.5–5.2)
Sodium: 138 mmol/L (ref 134–144)
Total Protein: 7.4 g/dL (ref 6.0–8.5)
eGFR: 96 mL/min/{1.73_m2} (ref >59)

## 2021-01-06 LAB — CBC
Hematocrit: 40.7 % (ref 34.0–46.6)
Hemoglobin: 15.1 g/dL (ref 11.1–15.9)
MCH: 31.4 pg (ref 26.6–33.0)
MCHC: 37.1 g/dL — ABNORMAL HIGH (ref 31.5–35.7)
MCV: 85 fL (ref 79–97)
Platelets: 252 10*3/uL (ref 150–450)
RBC: 4.81 x10E6/uL (ref 3.77–5.28)
RDW: 12.1 % (ref 11.7–15.4)
WBC: 4.5 10*3/uL (ref 3.4–10.8)

## 2021-01-08 ENCOUNTER — Encounter: Payer: Self-pay | Admitting: Family Medicine

## 2021-01-10 ENCOUNTER — Other Ambulatory Visit: Payer: Self-pay | Admitting: Family Medicine

## 2021-01-11 NOTE — Telephone Encounter (Signed)
This was refilled 6 days ago

## 2021-02-05 NOTE — Telephone Encounter (Signed)
Pt has an appt with Dr. Clent Ridges on 11/28. Sunday Spillers, CMA

## 2021-02-12 ENCOUNTER — Other Ambulatory Visit: Payer: Self-pay

## 2021-02-12 ENCOUNTER — Encounter: Payer: Self-pay | Admitting: Family Medicine

## 2021-02-12 ENCOUNTER — Ambulatory Visit (INDEPENDENT_AMBULATORY_CARE_PROVIDER_SITE_OTHER): Payer: 59 | Admitting: Family Medicine

## 2021-02-12 VITALS — BP 142/96 | HR 74 | Wt 147.5 lb

## 2021-02-12 DIAGNOSIS — R1084 Generalized abdominal pain: Secondary | ICD-10-CM | POA: Diagnosis not present

## 2021-02-12 DIAGNOSIS — R109 Unspecified abdominal pain: Secondary | ICD-10-CM | POA: Diagnosis not present

## 2021-02-12 DIAGNOSIS — I10 Essential (primary) hypertension: Secondary | ICD-10-CM

## 2021-02-12 LAB — POCT URINALYSIS DIP (CLINITEK)
Bilirubin, UA: NEGATIVE
Blood, UA: NEGATIVE
Glucose, UA: NEGATIVE mg/dL
Ketones, POC UA: NEGATIVE mg/dL
Leukocytes, UA: NEGATIVE
Nitrite, UA: NEGATIVE
POC PROTEIN,UA: NEGATIVE
Spec Grav, UA: <=1.005 — AB (ref 1.010–1.025)
Urobilinogen, UA: 0.2 E.U./dL
pH, UA: 6 (ref 5.0–8.0)

## 2021-02-12 MED ORDER — POLYETHYLENE GLYCOL 3350 17 GM/SCOOP PO POWD: 17.0000 g | Freq: Every day | ORAL | 1 refills | Status: DC

## 2021-02-12 MED ORDER — FAMOTIDINE 20 MG PO TABS: 20.0000 mg | ORAL_TABLET | Freq: Two times a day (BID) | ORAL | 0 refills | Status: DC

## 2021-02-12 NOTE — Progress Notes (Signed)
    SUBJECTIVE:   CHIEF COMPLAINT / HPI: abdominal pain  Presents for follow up for generalized abdominal pain. Seen in clinic on 10/21 workup at that time reassuring.  Was referred to GI for abdominal pain.  Since then patient reports no improvement in symptoms. Associated symptoms include burning and prickling sensation in midepigastric area that radiates down to mid suprapubic area. Worse with eating and provoked with any types of food. Better after defecation.  Endorses urinary urgency without dysuria.  Denies any constipation, diarrhea, bloody stool, hematemesis, nausea or vomiting or weight loss.  No Tobacco use, EtOH or recreational drugs.  Has not been taking medications for GERD.  Does not recall treatment for HPylori  PERTINENT  PMH / PSH:  GERD H.Pylori ABS positive 2015 Constipation   OBJECTIVE:   BP (!) 142/96   Pulse 74   Wt 147 lb 8 oz (66.9 kg)   SpO2 100%   BMI 25.32 kg/m    General: Alert, no acute distress Cardio: Normal S1 and S2, RRR, no r/m/g Pulm: CTAB, normal work of breathing Abdomen: Bowel sounds normal. Abdomen soft and mild tenderness over suprapubic area.  Negative rebound, negative Murphy's  ASSESSMENT/PLAN:   Abdominal pain Chronic generalized abdominal pain that is worsening with eating and relieved with defecation. Likely IBS and has been previously referred to GI for further evaluation. Hx of HPylori and GERD -Urea Breath test, Lipase, urine POCT today -Pepcid 20 mg BID -Miralax daily -Follow up with GI as scheduled -Will need colonoscopy for screening, referral sent 05/22.  -Follow up in 2 weeks or sooner if symptoms do not improve  HTN (hypertension) BP elevated today.  Likely secondary to discomfort.  Compliant with antihypertensives -Continue HCTZ 12.5 mg daily  -Follow up in 2 weeks     Christie Allan, MD [facility]

## 2021-02-12 NOTE — Patient Instructions (Addendum)
Thank you for coming to see me today. It was a pleasure.    Please call [facility] to schedule an appointment.   [address] [place], [place], [place] [address]  [phone]  We will get some labs today.  If they are abnormal or we need to do something about them, I will call you.  If they are normal, I will send you a message on MyChart (if it is active) or a letter in the mail.  If you don't hear from [place] in 2 weeks, please call the office at the number below.   Start Pepcid 20 mg twice a day  Start Miralax 1 capful daily to help regulate bowel movements  Please follow-up with PCP in 2 weeks  If you have any questions or concerns, please do not hesitate to call the office at [phone].  Best,   Dana Allan, MD

## 2021-02-13 ENCOUNTER — Telehealth: Payer: Self-pay

## 2021-02-13 LAB — LIPASE: Lipase: 38 U/L (ref 14–72)

## 2021-02-13 NOTE — Telephone Encounter (Signed)
Spoke patients daughter informed her of appt at [facility] Dec 14th at 2:30pm. Patient daughter took down the information. Aquilla Solian, CMA

## 2021-02-14 LAB — H. PYLORI BREATH TEST: H pylori Breath Test: NEGATIVE

## 2021-02-15 ENCOUNTER — Encounter: Payer: Self-pay | Admitting: Family Medicine

## 2021-02-16 ENCOUNTER — Encounter: Payer: Self-pay | Admitting: Family Medicine

## 2021-02-16 DIAGNOSIS — R109 Unspecified abdominal pain: Secondary | ICD-10-CM | POA: Insufficient documentation

## 2021-02-16 NOTE — Assessment & Plan Note (Addendum)
BP elevated today.  Likely secondary to discomfort.  Compliant with antihypertensives -Continue HCTZ 12.5 mg daily  -Follow up in 2 weeks

## 2021-02-16 NOTE — Assessment & Plan Note (Addendum)
Chronic generalized abdominal pain that is worsening with eating and relieved with defecation. Likely IBS and has been previously referred to GI for further evaluation. Hx of HPylori and GERD -Urea Breath test, Lipase, urine POCT today -Pepcid 20 mg BID -Miralax daily -Follow up with GI as scheduled -Will need colonoscopy for screening, referral sent 05/22.  -Follow up in 2 weeks or sooner if symptoms do not improve

## 2021-02-28 ENCOUNTER — Ambulatory Visit (INDEPENDENT_AMBULATORY_CARE_PROVIDER_SITE_OTHER): Payer: 59 | Admitting: Internal Medicine

## 2021-02-28 ENCOUNTER — Encounter: Payer: Self-pay | Admitting: Internal Medicine

## 2021-02-28 VITALS — BP 140/80 | HR 68 | Ht 63.0 in | Wt 143.2 lb

## 2021-02-28 DIAGNOSIS — R109 Unspecified abdominal pain: Secondary | ICD-10-CM | POA: Diagnosis not present

## 2021-02-28 DIAGNOSIS — K219 Gastro-esophageal reflux disease without esophagitis: Secondary | ICD-10-CM

## 2021-02-28 DIAGNOSIS — K625 Hemorrhage of anus and rectum: Secondary | ICD-10-CM

## 2021-02-28 DIAGNOSIS — R1013 Epigastric pain: Secondary | ICD-10-CM

## 2021-02-28 DIAGNOSIS — Z1211 Encounter for screening for malignant neoplasm of colon: Secondary | ICD-10-CM

## 2021-02-28 MED ORDER — PLENVU 140 G PO SOLR: 1.0000 | Freq: Once | ORAL | 0 refills | Status: AC

## 2021-02-28 NOTE — Patient Instructions (Signed)
Se le ha programado una ecografa abdominal en [facility] ([facility]imer [facility]iso [facility] hospital) el 23/02/2022 a las 10:00 a. m. Llegue 15 minutos antes de su cita para registrarse. Asegrese de no comer ni beber nada despus de la medianoche anterior a su cita. Si necesita reprogramar su cita, comunquese con radiologa al [phone]. Esta prueba suele tardar unos 30 minutos en realizarse.  Se le ha programado una endoscopia y [facility]. Siga las instrucciones escritas que se le dieron en su visita de hoy. Recoja sus suministros de preparacin en la farmacia dentro de los prximos 1 a 3 das. Si [place] inhaladores (aunque solo sea necesario), trigalos el da de su procedimiento.  Asegrate de beber 8 vasos de agua al [employer].

## 2021-02-28 NOTE — Progress Notes (Signed)
Chief Complaint: Epigastric ab pain, nausea, constipation  HPI : 50 year old female with history of H pylori infection, GERD, headache presents with epigastric ab pain, nausea, and constipation.  About 5 years ago, she had severe issues with nausea. Then she had abdominal pain again a few years ago, but her GI issues resolved on their own at that time. Her abdominal pain got worse again more recently. Now sometimes she will get a lot of nausea and has to rush to go to the bathroom to have a BM. Her pain is in the epigastric area and in the lower abdomen. She feels like the pain gets worse with eating. Pain gets better with having a BM. She feels like this discomfort feels similar to when she was infected with H pylori in the past. She sometimes induces herself to vomit in order to feel better. She has noticed some occasional bright red blood per rectum that occurs on occasion. She has never had a colonoscopy in the past. Denies fam hx of GI cancers. She has had a hysterectomy in the past. Denies NSAID use. She does have some issues with chest burning and regurgitation. Even though she has 1 BM per day, she does not feel like she clears out fully. Endorses trying to stay hydrated. Walks daily. 28 years ago she had a hemorrhoid surgery.   Past Medical History:  Diagnosis Date   Anemia    GERD (gastroesophageal reflux disease)    H. pylori infection    Headache    Hypertension    Left breast mass      Past Surgical History:  Procedure Laterality Date   ABDOMINAL HYSTERECTOMY  Pt states 10 years ago.   BREAST LUMPECTOMY WITH RADIOACTIVE SEED LOCALIZATION Left 01/30/2018   Procedure: LEFT BREAST LUMPECTOMY WITH RADIOACTIVE SEED LOCALIZATION;  Surgeon: Jovita Kussmaul, MD;  Location: [facility];  Service: General;  Laterality: Left;   CESAREAN SECTION     2 previous   hemorrhoidectomy     Family History  Problem Relation Age of Onset   Hypertension Mother    Diabetes Mother     Hypertension Father    Stroke Father    Hypertension Sister        many brothers and sisters   Diabetes Sister    Hypertension Brother    Colon cancer Neg Hx    Social History   Tobacco Use   Smoking status: Never   Smokeless tobacco: Never  Vaping Use   Vaping Use: Never used  Substance Use Topics   Alcohol use: Not Currently    Comment: rarely   Drug use: No   Current Outpatient Medications  Medication Sig Dispense Refill   hydrochlorothiazide (MICROZIDE) 12.5 MG capsule TAKE 1 CAPSULE BY MOUTH EVERY DAY 90 capsule 3   PEG-KCl-NaCl-NaSulf-Na Asc-C (PLENVU) 140 g SOLR Take 1 kit by mouth once for 1 dose. 1 each 0   No current facility-administered medications for this visit.   No Known Allergies   Review of Systems: All systems reviewed and negative except where noted in HPI.   Physical Exam: BP 140/80 (BP Location: Left Arm, Patient Position: Sitting, Cuff Size: Normal)    Pulse 68 Comment: irregular   Ht _0  (1.6 m)    Wt 143 lb 3 oz (64.9 kg)    BMI 25.36 kg/m  Constitutional: Pleasant,well-developed, female in no acute distress. HEENT: Normocephalic and atraumatic. Conjunctivae are normal. No scleral icterus. Cardiovascular: Normal rate, regular rhythm.  Pulmonary/chest: Effort normal and breath sounds normal. No wheezing, rales or rhonchi. Abdominal: Soft, nondistended, tender in the epigastric area and in the lower abdomen Extremities: No edema Neurological: Alert and oriented to person place and time. Skin: Skin is warm and dry. No rashes noted. Psychiatric: Normal mood and affect. Behavior is normal.  Labs 12/2020: CBC and CMP unremarkable.  Labs 01/2021: Lipase normal. H pylori breath test negative.  Ab U/S 01/14/14: IMPRESSION:  Increased hepatic echogenicity likely indicating steatosis without  other intra-abdominal abnormality identified. This may occasionally  be symptomatic.   EGD 07/27/14: ENDOSCOPIC IMPRESSION: 1. Mild gastritis in the  gastric body; multiple biopsies performed 2. The EGD otherwise appeared normal Path: Surgical [P], gastric antrum and gastric body - CHRONIC GASTRITIS. - A WARTHIN STARRY STAIN IS NEGATIVE FOR HELICOBACTER PYLORI. - NO INTESTINAL METAPLASIA OR MALIGNANCY IDENTIFIED.  ASSESSMENT AND PLAN: Epigastric and lower ab pain GERD Rectal bleeding Possible constipation Colon cancer screening Patient presents with epigastric and lower abdominal pain.  We will plan to evaluate further with abdominal ultrasound to rule out gallbladder pathology and EGD to rule out PUD/gastritis/duodenitis/esophagitis.  She is also due for colon cancer screening so we will plan to add on a colonoscopy.  I suspect that her lower abdominal discomfort may be due to constipation. - Encourage drinking at least 8 cups of water per day - Start Miralax QD - Check RUQ U/S - Check EGD/colonoscopy [facility] - Consider trial of PPI in the future - RTC in 3 months  [name], MD

## 2021-03-02 ENCOUNTER — Ambulatory Visit: Payer: No Typology Code available for payment source | Admitting: Family Medicine

## 2021-03-05 ENCOUNTER — Other Ambulatory Visit: Payer: Self-pay

## 2021-03-05 ENCOUNTER — Ambulatory Visit (INDEPENDENT_AMBULATORY_CARE_PROVIDER_SITE_OTHER): Payer: 59 | Admitting: Family Medicine

## 2021-03-05 VITALS — BP 138/98 | HR 69 | Ht 63.0 in | Wt 147.4 lb

## 2021-03-05 DIAGNOSIS — Z23 Encounter for immunization: Secondary | ICD-10-CM | POA: Diagnosis not present

## 2021-03-05 DIAGNOSIS — R109 Unspecified abdominal pain: Secondary | ICD-10-CM | POA: Diagnosis not present

## 2021-03-05 NOTE — Progress Notes (Signed)
° ° °  SUBJECTIVE:   CHIEF COMPLAINT / HPI:   Follow-up-abdominal pain: Patient was seen by gastroenterology on 02/28/2021.  It was noted she had a previous history of H. pylori and the patient stated this felt similar to her discomfort at that time.  GI recommended abdominal ultrasound to rule out gallbladder etiology and EGD to rule out gastritis or esophagitis.  They were also planning to do a colonoscopy as she was due for this.  They suspected abdominal discomfort may be due to constipation and so started MiraLAX.  She has follow-up scheduled with them in 2 months.  She has her right upper quadrant ultrasound scheduled for next week.  Today she states she still has some abdominal discomfort around eating. She has not tried the miralax. It has been about the same since she saw GI. No blood in stool since she saw GI. No fevers. Pain is sometimes right after eating and sometimes hours after eating. Is sometimes epigastric and sometimes all over abdomen. No vomiting. She has a bowel movement about every day.   Ipad spanish interpretor [id number] used for encounter.  PERTINENT  PMH / PSH: Hx abdominal discomfort  OBJECTIVE:   BP (!) 138/98    Pulse 69    Ht 5\' 3"  (1.6 m)    Wt 147 lb 6.4 oz (66.9 kg)    SpO2 100%    BMI 26.11 kg/m    General: NAD, pleasant, able to participate in exam Cardiac: RRR, no murmurs. Respiratory: CTAB, normal effort, No wheezes, rales or rhonchi Abdomen: Bowel sounds present, mild abdominal discomfort diffusely without acute surgical findings. Negative RUQ pain and negative murphy sign. No RLQ pain.  Neuro: alert, no obvious focal deficits Psych: Normal affect and mood  ASSESSMENT/PLAN:   Abdominal discomfort: Currently following with GI.  No improvement or worsening since last visit.  No acute surgical findings on physical exam.  Discomfort is sometimes generalized and sometimes epigastric.  She does have a history of H. pylori in the past.  Saw GI last week and they  recommended a trial of MiraLAX as well as EGD/colonoscopy.  She has not yet tried the MiraLAX.  She plans to have the EGD, Colonoscopy per GI. She has RUQ ultrasound set up for 12/23.  Recommended a trial of the MiraLAX and continue with current GI recommendations.  I recommend she schedule follow-up with myself about a week after she gets her EGD and colonoscopy to discuss next steps.  1/24, DO [address] [facility]

## 2021-03-05 NOTE — Patient Instructions (Signed)
For your abdominal pain I think it is reasonable to try the MiraLAX to see if improves her symptoms.  I would like to see you back once you have the endoscopy and colonoscopy that GI is going to do in February.  They also have the ultrasound set up and that would give [place] additional information.  If you develop any blood in the stool, fevers, vomiting I would like to see you back sooner.

## 2021-03-09 ENCOUNTER — Other Ambulatory Visit: Payer: Self-pay

## 2021-03-09 ENCOUNTER — Ambulatory Visit (HOSPITAL_COMMUNITY)
Admission: RE | Admit: 2021-03-09 | Discharge: 2021-03-09 | Disposition: A | Discharge status: Home / Self Care | Payer: 59 | Source: Ambulatory Visit | Attending: Internal Medicine | Admitting: Internal Medicine

## 2021-03-09 DIAGNOSIS — R109 Unspecified abdominal pain: Secondary | ICD-10-CM | POA: Diagnosis present

## 2021-03-09 DIAGNOSIS — K625 Hemorrhage of anus and rectum: Secondary | ICD-10-CM | POA: Insufficient documentation

## 2021-03-09 DIAGNOSIS — Z1211 Encounter for screening for malignant neoplasm of colon: Secondary | ICD-10-CM | POA: Insufficient documentation

## 2021-03-09 DIAGNOSIS — K219 Gastro-esophageal reflux disease without esophagitis: Secondary | ICD-10-CM | POA: Insufficient documentation

## 2021-03-09 DIAGNOSIS — R1013 Epigastric pain: Secondary | ICD-10-CM | POA: Insufficient documentation

## 2021-04-27 ENCOUNTER — Encounter: Payer: Self-pay | Admitting: Internal Medicine

## 2021-04-30 ENCOUNTER — Encounter: Payer: Self-pay | Admitting: Internal Medicine

## 2021-04-30 ENCOUNTER — Other Ambulatory Visit: Payer: Self-pay

## 2021-04-30 ENCOUNTER — Ambulatory Visit (AMBULATORY_SURGERY_CENTER): Payer: 59 | Admitting: Internal Medicine

## 2021-04-30 VITALS — BP 116/71 | HR 58 | Temp 97.8°F | Resp 16 | Ht 63.0 in | Wt 143.0 lb

## 2021-04-30 DIAGNOSIS — R1013 Epigastric pain: Secondary | ICD-10-CM

## 2021-04-30 DIAGNOSIS — K625 Hemorrhage of anus and rectum: Secondary | ICD-10-CM | POA: Diagnosis not present

## 2021-04-30 DIAGNOSIS — R1012 Left upper quadrant pain: Secondary | ICD-10-CM | POA: Diagnosis present

## 2021-04-30 DIAGNOSIS — K648 Other hemorrhoids: Secondary | ICD-10-CM

## 2021-04-30 MED ORDER — OMEPRAZOLE 40 MG PO CPDR: 40.0000 mg | DELAYED_RELEASE_CAPSULE | Freq: Every day | ORAL | 3 refills | Status: DC

## 2021-04-30 MED ORDER — HYDROCORTISONE (PERIANAL) 2.5 % EX CREA: 1.0000 "application " | TOPICAL_CREAM | Freq: Two times a day (BID) | CUTANEOUS | 1 refills | Status: AC

## 2021-04-30 MED ORDER — SODIUM CHLORIDE 0.9 % IV SOLN: 500.0000 mL | Freq: Once | INTRAVENOUS | Status: DC

## 2021-04-30 NOTE — Progress Notes (Signed)
Pt's states no medical or surgical changes since previsit or office visit.  VS CW  

## 2021-04-30 NOTE — Progress Notes (Signed)
GASTROENTEROLOGY PROCEDURE H&P NOTE   Primary Care Physician: Dana Allan, MD    Reason for Procedure:   Epigastric ab pain, lower ab pain, colon cancer screening, rectal bleeding  Plan:    EGD/colonoscopy  Patient is appropriate for endoscopic procedure(s) in the ambulatory ([facility]) setting.  The nature of the procedure, as well as the risks, benefits, and alternatives were carefully and thoroughly reviewed with the patient. Ample time for discussion and questions allowed. The patient understood, was satisfied, and agreed to proceed.     HPI: Christie Stone is a 51 y.o. female who presents for EGD/colonoscopy for evaluation of epigastric ab pain, lower ab pain, colon cancer screening, rectal bleeding .  Patient was most recently seen in the Gastroenterology Clinic on 02/28/21.  No interval change in medical history since that appointment. Please refer to that note for full details regarding GI history and clinical presentation.   Past Medical History:  Diagnosis Date   Anemia    GERD (gastroesophageal reflux disease)    H. pylori infection    Headache    Hypertension    Left breast mass     Past Surgical History:  Procedure Laterality Date   ABDOMINAL HYSTERECTOMY  Pt states 10 years ago.   BREAST LUMPECTOMY WITH RADIOACTIVE SEED LOCALIZATION Left 01/30/2018   Procedure: LEFT BREAST LUMPECTOMY WITH RADIOACTIVE SEED LOCALIZATION;  Surgeon: Griselda Miner, MD;  Location: [facility];  Service: General;  Laterality: Left;   CESAREAN SECTION     2 previous   hemorrhoidectomy      Prior to Admission medications   Medication Sig Start Date End Date Taking? Authorizing Provider  hydrochlorothiazide (MICROZIDE) 12.5 MG capsule TAKE 1 CAPSULE BY MOUTH EVERY DAY 01/05/21  Yes Littie Deeds, MD    Current Outpatient Medications  Medication Sig Dispense Refill   hydrochlorothiazide (MICROZIDE) 12.5 MG capsule TAKE 1 CAPSULE BY MOUTH EVERY DAY 90 capsule 3    Current Facility-Administered Medications  Medication Dose Route Frequency Provider Last Rate Last Admin   0.9 %  sodium chloride infusion  500 mL Intravenous Once Imogene Burn, MD        Allergies as of 04/30/2021   (No Known Allergies)    Family History  Problem Relation Age of Onset   Hypertension Mother    Diabetes Mother    Hypertension Father    Stroke Father    Hypertension Sister        many brothers and sisters   Diabetes Sister    Hypertension Brother    Colon cancer Neg Hx     Social History   Socioeconomic History   Marital status: Married    Spouse name: Not on file   Number of children: 2   Years of education: Not on file   Highest education level: Not on file  Occupational History   Not on file  Tobacco Use   Smoking status: Never   Smokeless tobacco: Never  Vaping Use   Vaping Use: Never used  Substance and Sexual Activity   Alcohol use: Not Currently    Comment: rarely   Drug use: No   Sexual activity: Yes    Birth control/protection: Surgical    Comment: 1st intercourse 24 yo-1 partner  Other Topics Concern   Not on file  Social History Narrative   Not on file   Social Determinants of Health   Financial Resource Strain: Not on file  Food Insecurity: Not on file  Transportation Needs:  Not on file  Physical Activity: Not on file  Stress: Not on file  Social Connections: Not on file  Intimate Partner Violence: Not on file    Physical Exam: Vital signs in last 24 hours: BP (!) 139/94    Pulse 76    Temp 97.8 F (36.6 C) (Temporal)    Ht 5\' 3"  (1.6 m)    Wt 143 lb (64.9 kg)    SpO2 100%    BMI 25.33 kg/m  GEN: NAD EYE: Sclerae anicteric ENT: MMM CV: Non-tachycardic Pulm: No increased WOB GI: Soft NEURO:  Alert & Oriented   , MD [facility]   04/30/2021 8:15 AM

## 2021-04-30 NOTE — Op Note (Addendum)
[facility] Patient Name: Christie Stone Procedure Date: 04/30/2021 7:19 AM MRN: [id number] Endoscopist: Sonny Masters "Christie Stone ,  Age: 51 Referring MD:  Date of Birth: Aug 11, 1970 Gender: Female Account #: [id number] Procedure:                Colonoscopy Indications:              Screening for colorectal malignant neoplasm, Lower                            ab pain Medicines:                Monitored Anesthesia Care Procedure:                Pre-Anesthesia Assessment:                           - Prior to the procedure, a History and Physical                            was performed, and patient medications and                            allergies were reviewed. The patient's tolerance of                            previous anesthesia was also reviewed. The risks                            and benefits of the procedure and the sedation                            options and risks were discussed with the patient.                            All questions were answered, and informed consent                            was obtained. Prior Anticoagulants: The patient has                            taken no previous anticoagulant or antiplatelet                            agents. ASA Grade Assessment: II - A patient with                            mild systemic disease. After reviewing the risks                            and benefits, the patient was deemed in                            satisfactory condition to undergo the procedure.  After obtaining informed consent, the colonoscope                            was passed under direct vision. Throughout the                            procedure, the patient's blood pressure, pulse, and                            oxygen saturations were monitored continuously. The                            Olympus CF-HQ190L ([id number]) Colonoscope was                            introduced through the anus and advanced to the the                             terminal ileum. The colonoscopy was performed                            without difficulty. The patient tolerated the                            procedure well. The quality of the bowel                            preparation was good. The terminal ileum, ileocecal                            valve, appendiceal orifice, and rectum were                            photographed. Scope In: 8:27:23 AM Scope Out: 8:43:42 AM Scope Withdrawal Time: 0 hours 12 minutes 3 seconds  Total Procedure Duration: 0 hours 16 minutes 19 seconds  Findings:                 The terminal ileum appeared normal.                           Biopsies were taken with a cold forceps in the                            entire colon for histology.                           Non-bleeding internal hemorrhoids were found during                            retroflexion. Complications:            No immediate complications. Estimated Blood Loss:     Estimated blood loss was minimal. Impression:               - The examined portion of the ileum was normal.                           -  Non-bleeding internal hemorrhoids.                           - Biopsies were taken with a cold forceps for                            histology in the entire colon. Recommendation:           - Discharge patient to home (with escort).                           - Start a trial of omeprazole 40 mg QD.                           - Await pathology results.                           - Return to GI clinic in 1 month.                           - The findings and recommendations were discussed                            with the patient.                           - Repeat colonoscopy in 10 years for screening                            purposes. Sonny Masters "Christie Stone,  04/30/2021 8:53:58 AM

## 2021-04-30 NOTE — Patient Instructions (Addendum)
Please read handouts provided. Continue present medications. Await pathology results. Start a trial of Omeprazole 40 mg everyday. Return to GI clinic in 1 month.  YOU HAD AN ENDOSCOPIC PROCEDURE TODAY AT THE [facility]:   Refer to the procedure report that was given to you for any specific questions about what was found during the examination.  If the procedure report does not answer your questions, please call your gastroenterologist to clarify.  If you requested that your care partner not be given the details of your procedure findings, then the procedure report has been included in a sealed envelope for you to review at your convenience later.  YOU SHOULD EXPECT: Some feelings of bloating in the abdomen. Passage of more gas than usual.  Walking can help get rid of the air that was put into your GI tract during the procedure and reduce the bloating. If you had a lower endoscopy (such as a colonoscopy or flexible sigmoidoscopy) you may notice spotting of blood in your stool or on the toilet paper. If you underwent a bowel prep for your procedure, you may not have a normal bowel movement for a few days.  Please Note:  You might notice some irritation and congestion in your nose or some drainage.  This is from the oxygen used during your procedure.  There is no need for concern and it should clear up in a day or so.  SYMPTOMS TO REPORT IMMEDIATELY:  Following lower endoscopy (colonoscopy or flexible sigmoidoscopy):  Excessive amounts of blood in the stool  Significant tenderness or worsening of abdominal pains  Swelling of the abdomen that is new, acute  Fever of 100F or higher  Following upper endoscopy (EGD)  Vomiting of blood or coffee ground material  New chest pain or pain under the shoulder blades  Painful or persistently difficult swallowing  New shortness of breath  Fever of 100F or higher  Black, tarry-looking stools  For urgent or emergent issues, a  gastroenterologist can be reached at any hour by calling [phone]. Do not use MyChart messaging for urgent concerns.    DIET:  We do recommend a small meal at first, but then you may proceed to your regular diet.  Drink plenty of fluids but you should avoid alcoholic beverages for 24 hours.  ACTIVITY:  You should plan to take it easy for the rest of today and you should NOT DRIVE or use heavy machinery until tomorrow (because of the sedation medicines used during the test).    FOLLOW UP: Our staff will call the number listed on your records 48-72 hours following your procedure to check on you and address any questions or concerns that you may have regarding the information given to you following your procedure. If we do not reach you, we will leave a message.  We will attempt to reach you two times.  During this call, we will ask if you have developed any symptoms of COVID 19. If you develop any symptoms (ie: fever, flu-like symptoms, shortness of breath, cough etc.) before then, please call [phone].  If you test positive for Covid 19 in the 2 weeks post procedure, please call and report this information to [place].    If any biopsies were taken you will be contacted by phone or by letter within the next 1-3 weeks.  Please call us at [phone] if you have not heard about the biopsies in 3 weeks.    SIGNATURES/CONFIDENTIALITY: You and/or your care partner have  signed paperwork which will be entered into your electronic medical record.  These signatures attest to the fact that that the information above on your After Visit Summary has been reviewed and is understood.  Full responsibility of the confidentiality of this discharge information lies with you and/or your care-partner. USTED TUVO UN PROCEDIMIENTO ENDOSCPICO HOY EN EL [facility]:   Lea el informe del procedimiento que se le entreg para cualquier pregunta especfica sobre lo que se Dentist.  Si el  informe del examen no responde a sus preguntas, por favor llame a su gastroenterlogo para aclararlo.  Si usted solicit que no se le den [employer] de lo que se Clinical cytogeneticist en su procedimiento al [employer] va a cuidar, entonces el informe del procedimiento se ha incluido en un sobre sellado para que usted lo revise despus cuando le sea ms conveniente.   LO QUE PUEDE ESPERAR: Algunas sensaciones de hinchazn en el abdomen.  Puede tener ms gases de lo normal.  El caminar puede ayudarle a eliminar el aire que se le puso en el tracto gastrointestinal durante el procedimiento y reducir la hinchazn.  Si le hicieron una endoscopia inferior (como una colonoscopia o una sigmoidoscopia flexible), podra notar manchas de sangre en las heces fecales o en el papel higinico.  Si se someti a una preparacin intestinal para su procedimiento, es posible que no tenga una evacuacin intestinal normal durante [employer].   Tenga en cuenta:  Es posible que note un poco de irritacin y congestin en la nariz o algn drenaje.  Esto es debido al oxgeno [employer] durante su procedimiento.  No hay que preocuparse y esto debe desaparecer ms o Regulatory affairs officer.   SNTOMAS PARA REPORTAR INMEDIATAMENTE:  Despus de una endoscopia inferior (colonoscopia o sigmoidoscopia flexible):  Cantidades excesivas de sangre en las heces fecales  Sensibilidad significativa o empeoramiento de los dolores abdominales   Hinchazn aguda del abdomen que antes no tena   Fiebre de 100F o ms   Despus de la endoscopia superior (EGD)  Vmitos de Retail buyer o material como caf molido   Dolor en el pecho o dolor debajo de los omplatos que antes no tena   Dolor o dificultad persistente para tragar  Falta de aire que antes no tena   Fiebre de 100F o ms  Heces fecales negras y pegajosas   Para asuntos urgentes o de Associate Professor, puede comunicarse con un gastroenterlogo a cualquier hora llamando al [phone].  DIETA:   Recomendamos una comida pequea al principio, pero luego puede continuar con su dieta normal.  Tome muchos lquidos, Tax adviser las bebidas alcohlicas durante 24 horas.    ACTIVIDAD:  Debe planear tomarse las cosas con calma por el resto del da y no debe CONDUCIR ni usar maquinaria pesada Patent examiner (debido a los medicamentos de sedacin utilizados durante el examen).     SEGUIMIENTO: Nuestro personal llamar al nmero que aparece en su historial al siguiente da hbil de su procedimiento para ver cmo se siente y para responder cualquier pregunta o inquietud que pueda tener con respecto a la informacin que se le dio despus del procedimiento. Si no podemos contactarle, le dejaremos un mensaje.  Sin embargo, si se siente bien y no tiene English as a second language teacher, no es necesario que nos devuelva la llamada.  Asumiremos que ha regresado a sus actividades diarias normales sin incidentes. Si se le tomaron algunas biopsias, le contactaremos por telfono o por carta en las  prximas 3 semanas.  Si no ha sabido [employer] biopsias en el transcurso de 3 semanas, por favor llmenos al [phone].   FIRMAS/CONFIDENCIALIDAD: Usted y/o el acompaante que le cuide han firmado documentos que se ingresarn en su historial mdico electrnico.  Estas firmas atestiguan el hecho de que la informacin anterior

## 2021-04-30 NOTE — Progress Notes (Signed)
Sedate, gd SR, tolerated procedure well, VSS, report to RN 

## 2021-04-30 NOTE — Op Note (Signed)
[facility] Patient Name: Christie Stone Procedure Date: 04/30/2021 7:18 AM MRN: [id number] Endoscopist: Nicole Kindred "Eulah Pont ,  Age: 51 Referring MD:  Date of Birth: 01-21-1971 Gender: Female Account #: [id number] Procedure:                Upper GI endoscopy Indications:              Epigastric abdominal pain Medicines:                Monitored Anesthesia Care Procedure:                Pre-Anesthesia Assessment:                           - Prior to the procedure, a History and Physical                            was performed, and patient medications and                            allergies were reviewed. The patient's tolerance of                            previous anesthesia was also reviewed. The risks                            and benefits of the procedure and the sedation                            options and risks were discussed with the patient.                            All questions were answered, and informed consent                            was obtained. Prior Anticoagulants: The patient has                            taken no previous anticoagulant or antiplatelet                            agents. ASA Grade Assessment: II - A patient with                            mild systemic disease. After reviewing the risks                            and benefits, the patient was deemed in                            satisfactory condition to undergo the procedure.                           After obtaining informed consent, the endoscope was  passed under direct vision. Throughout the                            procedure, the patient's blood pressure, pulse, and                            oxygen saturations were monitored continuously. The                            Endoscope was introduced through the mouth, and                            advanced to the second part of duodenum. The upper                            GI endoscopy was  accomplished without difficulty.                            The patient tolerated the procedure well. Scope In: Scope Out: Findings:                 The examined esophagus was normal. Biopsies were                            taken with a cold forceps for histology.                           Localized erythematous mucosa without bleeding was                            found in the gastric antrum. Biopsies were taken                            with a cold forceps for histology.                           The examined duodenum was normal. Biopsies were                            taken with a cold forceps for histology. Complications:            No immediate complications. Estimated Blood Loss:     Estimated blood loss was minimal. Impression:               - Normal esophagus. Biopsied.                           - Erythematous mucosa in the antrum. Biopsied.                           - Normal examined duodenum. Biopsied. Recommendation:           - Await pathology results.                           - Perform a colonoscopy today. Nicole Kindred "Alan Ripper" [place],  04/30/2021  8:50:05 AM

## 2021-04-30 NOTE — Progress Notes (Signed)
Called to room to assist during endoscopic procedure.  Patient ID and intended procedure confirmed with present staff. Received instructions for my participation in the procedure from the performing physician.  

## 2021-05-02 ENCOUNTER — Telehealth: Payer: Self-pay

## 2021-05-02 ENCOUNTER — Encounter: Payer: Self-pay | Admitting: Internal Medicine

## 2021-05-02 NOTE — Telephone Encounter (Signed)
°  Follow up Call-  Call back number 04/30/2021  Post procedure Call Back phone  # [phone]  Permission to leave phone message Yes  Some recent data might be hidden     Patient questions:  Do you have a fever, pain , or abdominal swelling? No. Pain Score  0 *  Have you tolerated food without any problems? Yes.    Have you been able to return to your normal activities? Yes.    Do you have any questions about your discharge instructions: Diet   No. Medications  No. Follow up visit  No.  Do you have questions or concerns about your Care? No.  Actions: * If pain score is 4 or above: No action needed, pain <4.  Have you developed a fever since your procedure? no  2.   Have you had an respiratory symptoms (SOB or cough) since your procedure? no  3.   Have you tested positive for COVID 19 since your procedure no  4.   Have you had any family members/close contacts diagnosed with the COVID 19 since your procedure?  no   If yes to any of these questions please route to Joylene John, RN and Joella Prince, RN

## 2021-05-24 ENCOUNTER — Ambulatory Visit (INDEPENDENT_AMBULATORY_CARE_PROVIDER_SITE_OTHER): Payer: 59 | Admitting: Internal Medicine

## 2021-05-24 ENCOUNTER — Encounter: Payer: Self-pay | Admitting: Internal Medicine

## 2021-05-24 VITALS — BP 117/60 | HR 71 | Wt 143.0 lb

## 2021-05-24 DIAGNOSIS — K219 Gastro-esophageal reflux disease without esophagitis: Secondary | ICD-10-CM | POA: Diagnosis not present

## 2021-05-24 DIAGNOSIS — R109 Unspecified abdominal pain: Secondary | ICD-10-CM

## 2021-05-24 MED ORDER — OMEPRAZOLE 40 MG PO CPDR: 40.0000 mg | DELAYED_RELEASE_CAPSULE | Freq: Every day | ORAL | 3 refills | Status: DC

## 2021-05-24 NOTE — Patient Instructions (Signed)
We have sent the following medications to your pharmacy for you to pick up at your convenience:   Omeprazole refills ? ?GERD Handout given ? ?Follow up in 6 months ? ?If you are age 53 or older, your body mass index should be between 23-30. Your Body mass index is 25.33 kg/m?Marland Kitchen If this is out of the aforementioned range listed, please consider follow up with your Primary Care Provider. ? ?If you are age 20 or younger, your body mass index should be between 19-25. Your Body mass index is 25.33 kg/m?Marland Kitchen If this is out of the aformentioned range listed, please consider follow up with your Primary Care Provider.  ? ?________________________________________________________ ? ?The [facility] providers would like to encourage you to use [facility] to communicate with providers for non-urgent requests or questions.  Due to long hold times on the telephone, sending your provider a message by [facility] may be a faster and more efficient way to get a response.  Please allow 48 business hours for a response.  Please remember that this is for non-urgent requests.  ?_______________________________________________________  ? ?I appreciate the  opportunity to care for you ? ?Thank You  ? ?Sonny Masters Dorsey,MD  ?

## 2021-05-24 NOTE — Progress Notes (Signed)
? ?Chief Complaint: Epigastric ab pain ? ?HPI : 51 year old female with history of H pylori infection, GERD, headache presents for follow up of epigastric ab pain ? ?Interval History: Visit was conducted with a Engineer, structural. She had her EGD and colonoscopy on 04/30/21.  She started her omeprazole medication and has seen significant improvement in her.  She has only had 1 episode of abdominal pain since her procedures were done.  When she does not take her omeprazole 40 mg daily, she will notice that the abdominal pain comes back.  She is taking omeprazole before she eats.  She has not adjusted her diet to reduce reflux inducing foods yet.  Does mention that she has some issues with bloating and wonders if there are certain foods that she should be avoiding in order to reduce the amount of bloating she is experiencing.  She is having stools that are mushy. She is having one BM per day. She feels like she is having good BMs.  ? ?Current Outpatient Medications  ?Medication Sig Dispense Refill  ? hydrochlorothiazide (MICROZIDE) 12.5 MG capsule TAKE 1 CAPSULE BY MOUTH EVERY DAY 90 capsule 3  ? omeprazole (PRILOSEC) 40 MG capsule Take 1 capsule (40 mg total) by mouth daily. 90 capsule 3  ? ?No current facility-administered medications for this visit.  ? ?Review of Systems: ?All systems reviewed and negative except where noted in HPI.  ? ?Physical Exam: ?BP 117/60   Pulse 71   Wt 143 lb (64.9 kg)   SpO2 97%   BMI 25.33 kg/m?  ?Constitutional: Pleasant,well-developed, female in no acute distress. ?HEENT: Normocephalic and atraumatic. Conjunctivae are normal. No scleral icterus. ?Cardiovascular: Normal rate, regular rhythm.  ?Pulmonary/chest: Effort normal and breath sounds normal. No wheezing, rales or rhonchi. ?Abdominal: Soft, nondistended, non-tender ?Extremities: No edema ?Neurological: Alert and oriented to person place and time. ?Skin: Skin is warm and dry. No rashes noted. ?Psychiatric: Normal mood and  affect. Behavior is normal. ? ?Labs 12/2020: CBC and CMP unremarkable. ? ?Labs 01/2021: Lipase normal. H pylori breath test negative. ? ?Ab U/S 01/14/14: ?IMPRESSION:  ?Increased hepatic echogenicity likely indicating steatosis without  ?other intra-abdominal abnormality identified. This may occasionally  ?be symptomatic.  ? ?EGD 07/27/14: ?ENDOSCOPIC IMPRESSION: ?1. Mild gastritis in the gastric body; multiple biopsies performed ?2. The EGD otherwise appeared normal ?Path: ?Surgical [P], gastric antrum and gastric body ?- CHRONIC GASTRITIS. ?- A WARTHIN STARRY STAIN IS NEGATIVE FOR HELICOBACTER PYLORI. ?- NO INTESTINAL METAPLASIA OR MALIGNANCY IDENTIFIED. ? ?EGD 04/30/21: ?- Normal esophagus. Biopsied. ?- Erythematous mucosa in the antrum. Biopsied. ?- Normal examined duodenum. Biopsied. ?Path: ?1. Surgical [P], duodenum ?- BENIGN DUODENAL MUCOSA ?- NO ACUTE INFLAMMATION, VILLOUS BLUNTING OR INCREASED INTRAEPITHELIAL LYMPHOCYTES IDENTIFIED ?2. Surgical [P], gastric ?- BENIGN GASTRIC MUCOSA ?- NO H. PYLORI, INTESTINAL METAPLASIA OR MALIGNANCY IDENTIFIED ?3. Surgical [P], esophagus ?- BENIGN SQUAMOUS MUCOSA ?- NO INCREASED INTRAEPITHELIAL EOSINOPHILS ? ?Colonoscopy 04/30/21: ?- The examined portion of the ileum was normal. ?- Non-bleeding internal hemorrhoids. ?- Biopsies were taken with a cold forceps for histology in the entire colon. ?Path: ?4. Surgical [P], colon nos, random sites ?- BENIGN COLONIC MUCOSA ?- NO ACTIVE INFLAMMATION OR EVIDENCE OF MICROSCOPIC COLITIS ?- NO HIGH-GRADE DYSPLASIA OR MALIGNANCY IDENTIFIED ? ?ASSESSMENT AND PLAN: ?Epigastric and lower ab pain ?GERD ?Hemorrhoids ?Colon cancer screening ?Patient has had significant improvement in her abdominal pain on PPI daily.  We will attempt to optimize her therapy further by recommending specific foods to avoid, including spicy foods,  fatty foods, citrus-based foods, tomato-based foods, caffeine and alcohol.  Patient also wanted to know about foods that  would produce bloating but did not want to overload the patient on diet recommendations at this time.  Could consider educating the patient on low FODMAP diet in the future. ?- GERD handout  ?- Continue omeprazole 40 mg QD, take 30 minutes before meals. Refilled. ?- Repeat colonoscopy in 04/2031 for colon cancer screening ?- RTC in 6 months. Can attempt to reduce the dose of her omeprazole in the future ? ?Christie Pont, MD ? ?I spent 42 minutes of time, including in depth chart review, independent review of results as outlined above, communicating results with the patient directly, face-to-face time with the patient, coordinating care, ordering studies and medications as appropriate, and documentation. ? ? ?

## 2021-11-15 ENCOUNTER — Encounter (HOSPITAL_COMMUNITY): Payer: Self-pay | Admitting: Emergency Medicine

## 2021-11-15 ENCOUNTER — Ambulatory Visit (HOSPITAL_COMMUNITY)
Admission: EM | Admit: 2021-11-15 | Discharge: 2021-11-15 | Disposition: A | Discharge status: Home / Self Care | Payer: 59 | Attending: Physician Assistant | Admitting: Physician Assistant

## 2021-11-15 ENCOUNTER — Telehealth (HOSPITAL_COMMUNITY): Payer: Self-pay | Admitting: Emergency Medicine

## 2021-11-15 DIAGNOSIS — I10 Essential (primary) hypertension: Secondary | ICD-10-CM

## 2021-11-15 MED ORDER — LISINOPRIL 5 MG PO TABS
5.0000 mg | ORAL_TABLET | Freq: Every day | ORAL | 2 refills | Status: DC
Start: 2021-11-15 — End: 2023-04-14

## 2021-11-15 NOTE — ED Provider Notes (Signed)
[facility]    CSN: [id number] Arrival date & time: 11/15/21  1552      History   Chief Complaint Chief Complaint  Patient presents with   Hypertension    HPI Christie Stone is a 51 y.o. female.   Pt complains of her blood pressure being elevated.  Pt reports she was on 2 blood pressure medications.  Patient reports her primary care physician stopped her captopril.  Patient states that her blood pressure was well controlled and they did not feel like she needed to drugs.  Patient reports her blood pressure has been elevated.  She has been taking her hydrochlorothiazide but blood pressure is still staying high.  She has not called her physician.  Patient denies any chest pain she denies any shortness of breath.  The history is provided by the patient. No language interpreter was used.    Past Medical History:  Diagnosis Date   Anemia    GERD (gastroesophageal reflux disease)    H. 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   BREAST LUMPECTOMY WITH RADIOACTIVE SEED LOCALIZATION Left 01/30/2018   Procedure: LEFT BREAST LUMPECTOMY WITH RADIOACTIVE SEED  LOCALIZATION;  Surgeon: Jovita Kussmaul, MD;  Location: [facility];  Service: General;  Laterality: Left;   CESAREAN SECTION     2 previous   hemorrhoidectomy      OB History     Gravida  2   Para  2   Term      Preterm      AB      Living  2      SAB      IAB      Ectopic      Multiple      Live Births  2            Home Medications    Prior to Admission medications   Medication Sig Start Date End Date Taking? Authorizing Provider  hydrochlorothiazide (MICROZIDE) 12.5 MG capsule TAKE 1 CAPSULE BY MOUTH EVERY DAY 01/05/21  Yes Zola Button, MD  lisinopril (ZESTRIL) 5 MG tablet Take 1 tablet (5 mg total) by mouth daily. 11/15/21 11/15/22 Yes Fransico Meadow, PA-C  omeprazole (PRILOSEC) 40 MG capsule Take 1 capsule (40 mg total) by mouth daily. 05/24/21  Yes Sharyn Creamer, MD    Family History Family History  Problem Relation Age of Onset   Hypertension Mother    Diabetes Mother    Hypertension Father    Stroke Father    Hypertension Sister        many brothers and sisters   Diabetes Sister  Hypertension Brother    Colon cancer Neg Hx     Social History Social History   Tobacco Use   Smoking status: Never   Smokeless tobacco: Never  Vaping Use   Vaping Use: Never used  Substance Use Topics   Alcohol use: Not Currently    Comment: rarely   Drug use: No     Allergies   Patient has no known allergies.   Review of Systems Review of Systems  All other systems reviewed and are negative.    Physical Exam Triage Vital Signs ED Triage Vitals  Enc Vitals Group     BP 11/15/21 1620 (!) 180/108     Pulse Rate 11/15/21 1620 76     Resp 11/15/21 1620 18     Temp 11/15/21 1620 98.6 F (37 C)     Temp Source 11/15/21 1620 Oral     SpO2 11/15/21 1620 99 %     Weight --      Height --      Head Circumference --      Peak Flow --      Pain Score 11/15/21 1621 0     Pain Loc --      Pain Edu? --      Excl. in GC? --     No data found.  Updated Vital Signs BP (!) 180/108 (BP Location: Right Arm)   Pulse 76   Temp 98.6 F (37 C) (Oral)   Resp 18   SpO2 99%   Visual Acuity Right Eye Distance:   Left Eye Distance:   Bilateral Distance:    Right Eye Near:   Left Eye Near:    Bilateral Near:     Physical Exam Vitals and nursing note reviewed.  Constitutional:      Appearance: She is well-developed.  HENT:     Head: Normocephalic.  Cardiovascular:     Rate and Rhythm: Normal rate.  Pulmonary:     Effort: Pulmonary effort is normal.  Abdominal:     General: There is no distension.  Musculoskeletal:        General: Normal range of motion.     Cervical back: Normal range of motion.  Skin:    General: Skin is warm.  Neurological:     General: No focal deficit present.     Mental Status: She is alert and oriented to person, place, and time.      UC Treatments / Results  Labs (all labs ordered are listed, but only abnormal results are displayed) Labs Reviewed - No data to display  EKG   Radiology No results found.  Procedures Procedures (including critical care time)  Medications Ordered in UC Medications - No data to display  Initial Impression / Assessment and Plan / UC Course  I have reviewed the triage vital signs and the nursing notes.  Pertinent labs & imaging results that were available during my care of the patient were reviewed by me and considered in my medical decision making (see chart for details).     Patient's primary care notes reviewed patient was on captopril 3 times daily.  I will try adding lisinopril 5 mg to her current hydrochlorothiazide dosage.  Patient is advised to schedule to see her primary care physician for recheck Final Clinical Impressions(s) / UC Diagnoses   Final diagnoses:  Essential hypertension     Discharge Instructions      Follow up with your Physician for recheck    ED Prescriptions  Medication Sig Dispense Auth.  Provider   lisinopril (ZESTRIL) 5 MG tablet Take 1 tablet (5 mg total) by mouth daily. 30 tablet Elson Areas, [place]      PDMP not reviewed this encounter. An After Visit Summary was printed and given to the patient.    Elson Areas, [place] 11/15/21 2020

## 2021-11-15 NOTE — Discharge Instructions (Signed)
Follow up with your Physician for recheck  

## 2021-11-15 NOTE — ED Triage Notes (Signed)
Patient is here for problems with hypertension.  Patient is currently taken HCTZ 12.5mg .  Patient c/o headache and some dizziness.  Patient does have a PCP.

## 2022-06-17 IMAGING — US US ABDOMEN LIMITED
1 series · 14 of 25 positions shown · non-contrast
Comparison: Ultrasound 01/14/2014

CLINICAL DATA: Abdomen pain

EXAM:
ULTRASOUND ABDOMEN LIMITED RIGHT UPPER QUADRANT

[Series 1: us abdomen limited · 14 of 52 slices shown]
[im 1/52]
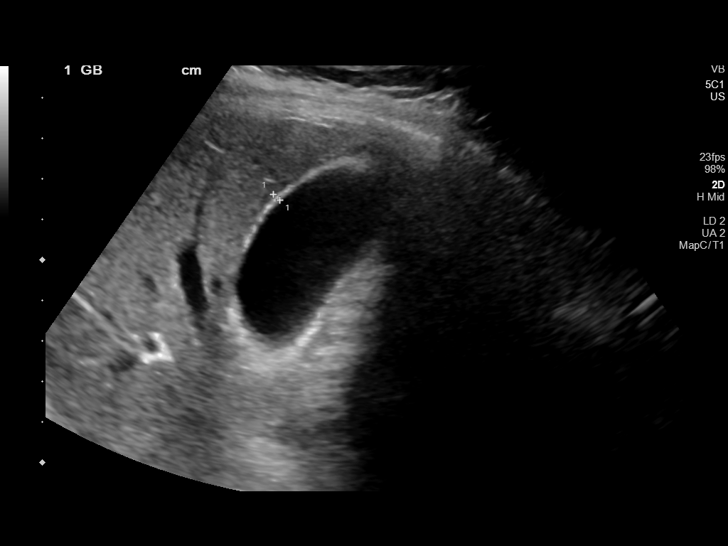
[im 5/52]
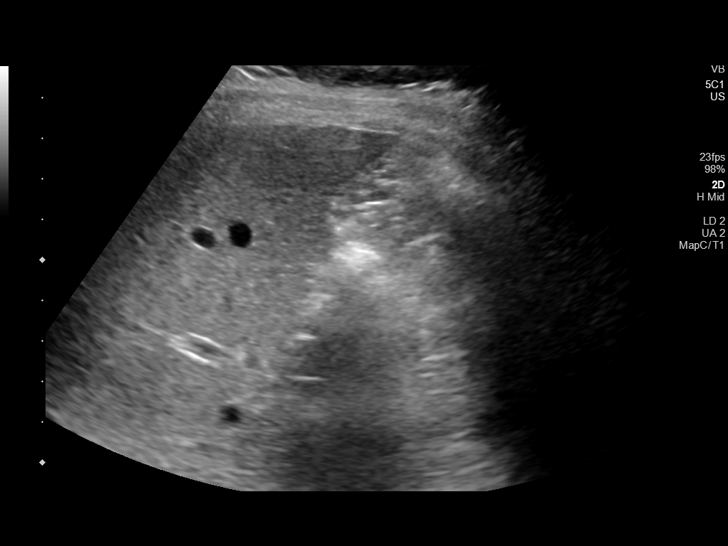
[im 9/52]
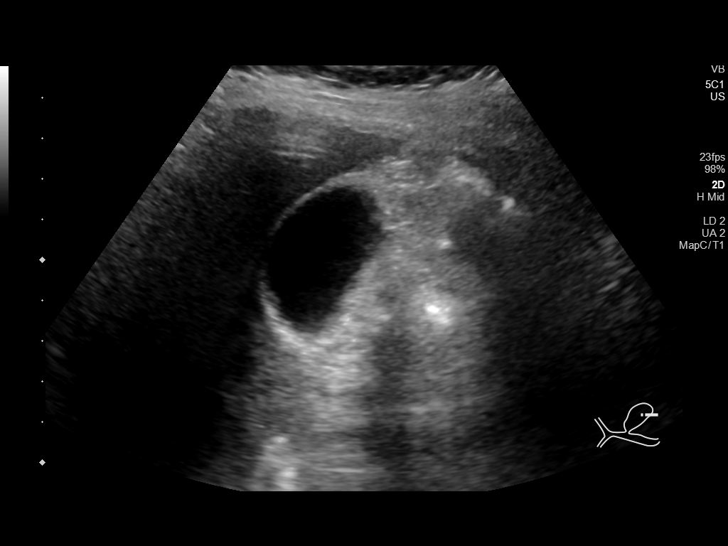
[im 13/52]
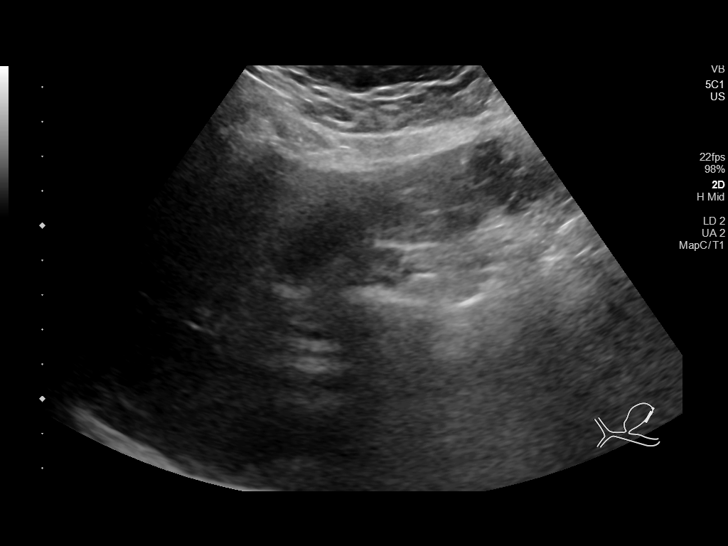
[im 18/52]
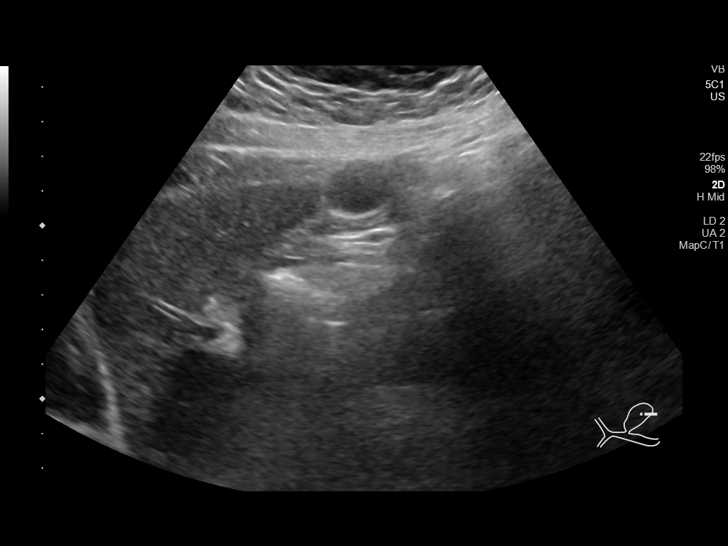
[im 20/52]
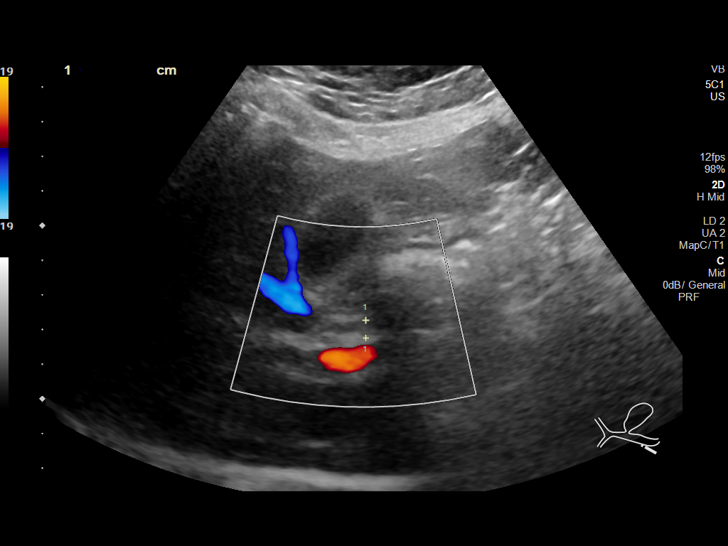
[im 24/52]
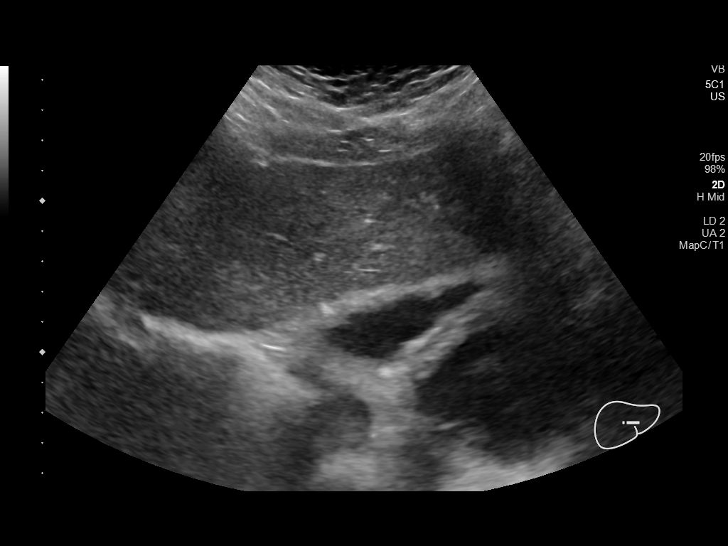
[im 28/52]
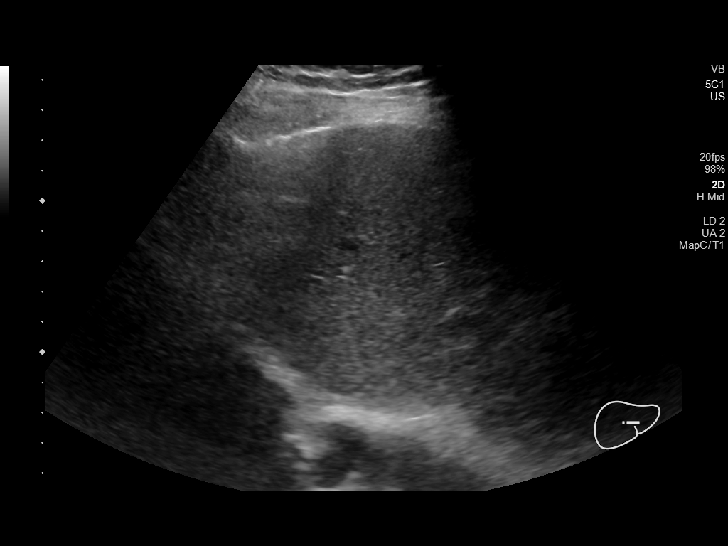
[im 32/52]
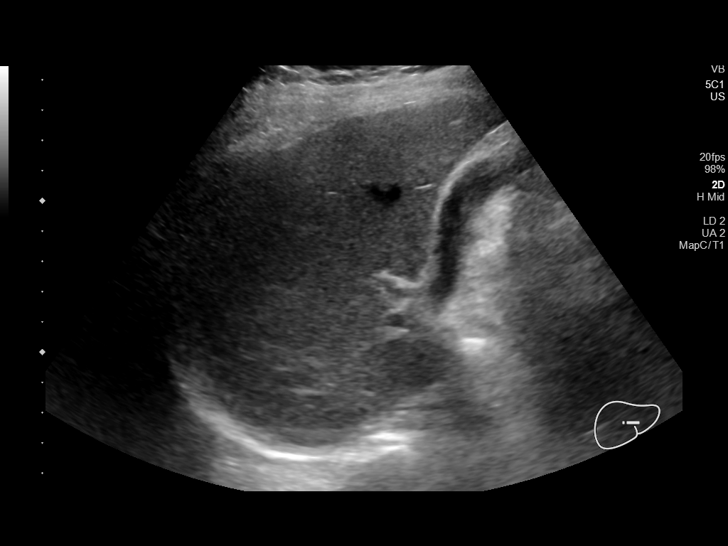
[im 35/52]
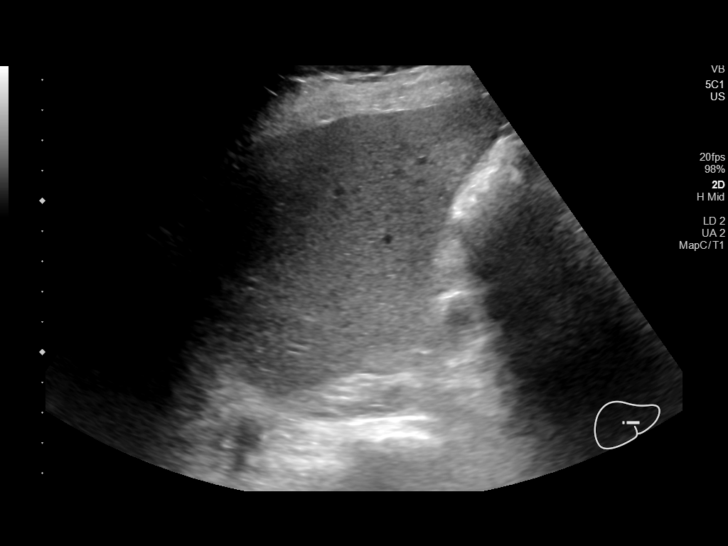
[im 39/52]
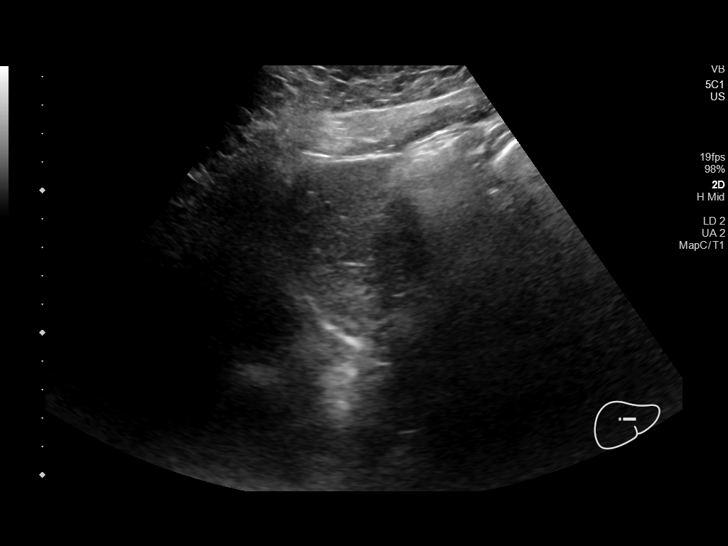
[im 43/52]
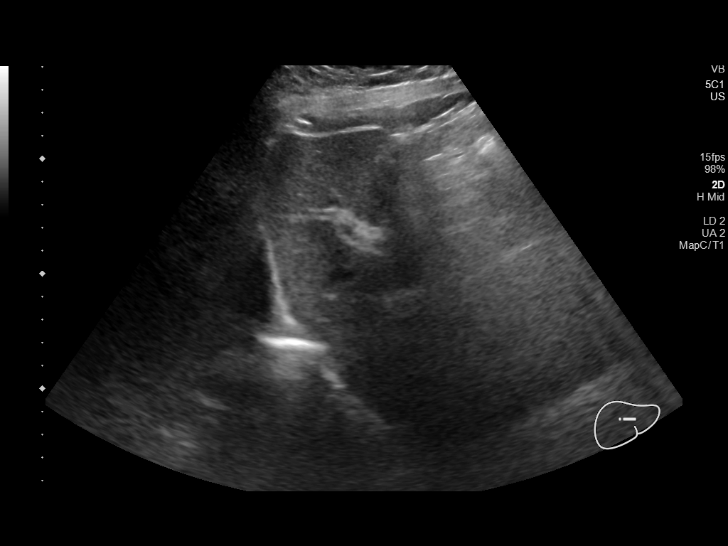
[im 47/52]
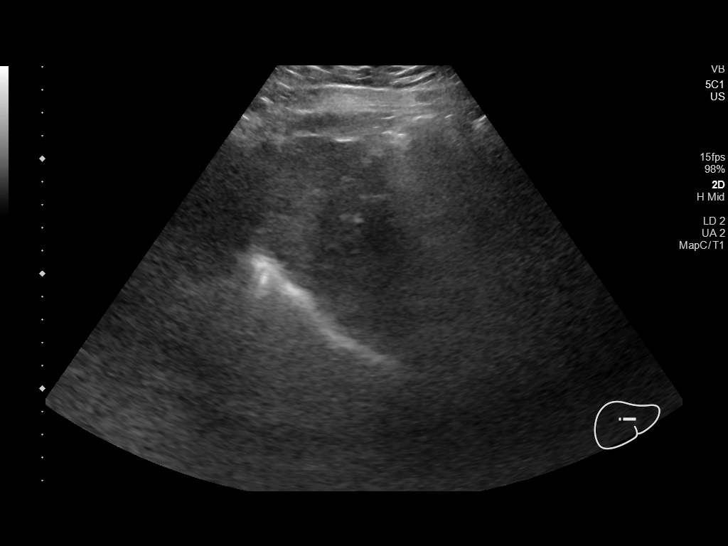
[im 52/52]
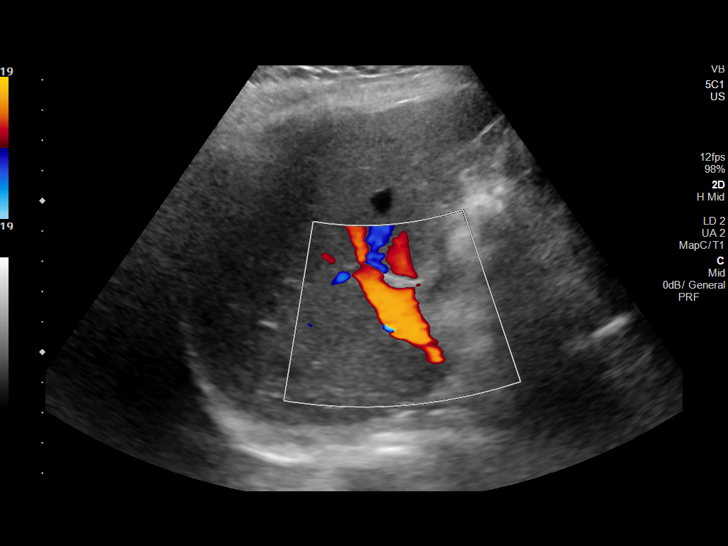

[14 of 25 positions shown; findings below may reference images not displayed]

FINDINGS: Gallbladder:

No gallstones or wall thickening visualized. No sonographic Murphy
sign noted by sonographer.

Common bile duct:

Diameter: 5 mm

Liver:

No focal lesion identified. Within normal limits in parenchymal
echogenicity. Portal vein is patent on color Doppler imaging with
normal direction of blood flow towards the liver.

Other: None.
IMPRESSION: Negative examination

## 2022-12-25 ENCOUNTER — Ambulatory Visit (HOSPITAL_COMMUNITY)
Admission: EM | Admit: 2022-12-25 | Discharge: 2022-12-25 | Disposition: A | Discharge status: Home / Self Care | Payer: Self-pay | Attending: Internal Medicine | Admitting: Internal Medicine

## 2022-12-25 ENCOUNTER — Encounter (HOSPITAL_COMMUNITY): Payer: Self-pay

## 2022-12-25 DIAGNOSIS — N898 Other specified noninflammatory disorders of vagina: Secondary | ICD-10-CM | POA: Insufficient documentation

## 2022-12-25 LAB — POCT URINALYSIS DIP (MANUAL ENTRY)
Bilirubin, UA: NEGATIVE
Blood, UA: NEGATIVE
Glucose, UA: NEGATIVE mg/dL
Ketones, POC UA: NEGATIVE mg/dL
Leukocytes, UA: NEGATIVE
Nitrite, UA: NEGATIVE
Protein Ur, POC: NEGATIVE mg/dL
Spec Grav, UA: 1.01 (ref 1.010–1.025)
Urobilinogen, UA: 0.2 U/dL
pH, UA: 6.5 (ref 5.0–8.0)

## 2022-12-25 NOTE — ED Triage Notes (Signed)
Pt with c/o left lower abdominal pain radiating into her back. Patient also states she is having itching in her private area.

## 2022-12-25 NOTE — ED Provider Notes (Signed)
[facility]    CSN: [id number] Arrival date & time: 12/25/22  1249      History   Chief Complaint Chief Complaint  Patient presents with   Urinary Frequency    HPI Tanya Crothers is a 52 y.o. female.    Urinary Frequency Pertinent negatives include no abdominal pain.  Possible urinary tract infection symptom onset 3 days ago symptoms include crampy left lower quadrant pain comes and goes, radiates to left hip, had some vaginal itching and 1 episode of yellow vaginal discharge.  Admits urinary frequency last p.m. woke frequently during the night without dysuria, hematuria or cloudy urine.  Took Azo several days ago.  Admits possibility of STI.  Denies recent antibiotic use Denies urgency, hematuria, fever, chills, sweats, genital rash, nausea, vomiting, diarrhea.  Past medical history of hypertension.  Had a hysterectomy.  Past Medical History:  Diagnosis Date   Anemia    GERD (gastroesophageal reflux disease)    H. 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   BREAST LUMPECTOMY WITH RADIOACTIVE SEED LOCALIZATION Left 01/30/2018   Procedure: LEFT  BREAST LUMPECTOMY WITH RADIOACTIVE SEED LOCALIZATION;  Surgeon: Griselda Miner, MD;  Location: [facility];  Service: General;  Laterality: Left;   CESAREAN SECTION     2 previous   hemorrhoidectomy      OB History     Gravida  2   Para  2   Term      Preterm      AB      Living  2      SAB      IAB      Ectopic      Multiple      Live Births  2            Home Medications    Prior to Admission medications   Medication Sig Start Date End Date Taking? Authorizing Provider  hydrochlorothiazide (MICROZIDE) 12.5 MG capsule TAKE 1 CAPSULE BY MOUTH EVERY DAY 01/05/21   Littie Deeds, MD  lisinopril (ZESTRIL) 5 MG tablet Take 1 tablet (5 mg total) by mouth daily. 11/15/21 11/15/22  Elson Areas, PA-C  omeprazole (PRILOSEC) 40 MG capsule Take 1 capsule (40 mg total) by mouth daily. 05/24/21   Imogene Burn, MD    Family History Family History  Problem Relation Age of Onset   Hypertension Mother    Diabetes Mother    Hypertension Father    Stroke Father    Hypertension Sister        many brothers and sisters   Diabetes Sister    Hypertension Brother  Colon cancer Neg Hx     Social History Social History   Tobacco Use   Smoking status: Never   Smokeless tobacco: Never  Vaping Use   Vaping status: Never Used  Substance Use Topics   Alcohol use: Not Currently    Comment: rarely   Drug use: No     Allergies   Patient has no known allergies.   Review of Systems Review of Systems  Constitutional:  Negative for appetite change, chills, fatigue and fever.  Gastrointestinal:  Negative for abdominal pain, nausea and vomiting.  Genitourinary:  Positive for frequency, pelvic pain and vaginal discharge. Negative for decreased urine volume, flank pain, hematuria, urgency, vaginal bleeding and vaginal pain.  Neurological:  Negative for dizziness.     Physical Exam Triage Vital Signs ED Triage Vitals [12/25/22 1319]  Encounter Vitals  Group     BP (!) 145/85     Systolic BP Percentile      Diastolic BP Percentile      Pulse Rate 69     Resp 16     Temp 98.1 F (36.7 C)     Temp Source Oral     SpO2 98 %     Weight      Height      Head Circumference      Peak Flow      Pain Score 6     Pain Loc      Pain Education      Exclude from Growth Chart    No data found.  Updated Vital Signs BP (!) 145/85 (BP Location: Right Arm)   Pulse 69   Temp 98.1 F (36.7 C) (Oral)   Resp 16   SpO2 98%   Visual Acuity Right Eye Distance:   Left Eye Distance:   Bilateral Distance:    Right Eye Near:   Left Eye Near:    Bilateral Near:     Physical Exam Vitals and nursing note reviewed.  Constitutional:      Appearance: She is not ill-appearing.  HENT:     Head: Normocephalic and atraumatic.  Cardiovascular:     Rate and Rhythm: Normal rate and regular rhythm.     Heart sounds: Normal heart sounds.  Pulmonary:     Effort: Pulmonary effort is normal.     Breath sounds: Normal breath sounds.  Abdominal:     General: Bowel sounds are normal. There is no distension.     Palpations: Abdomen is soft.     Tenderness: There is no abdominal tenderness. There is no right CVA tenderness, left CVA tenderness or guarding.  Skin:    General: Skin is warm and dry.  Neurological:     Mental Status: She is alert and oriented to person, place, and time.  Psychiatric:        Mood and Affect: Mood normal.      UC Treatments / Results  Labs (all labs ordered are listed, but only abnormal results are displayed) Labs Reviewed  POCT URINALYSIS DIP (MANUAL ENTRY) - Abnormal; Notable for the following components:      Result Value   Clarity, UA cloudy (*)    All other components within normal limits    EKG   Radiology No results found.  Procedures Procedures (including critical care time)  Medications Ordered in UC Medications - No data to display  Initial Impression / Assessment and Plan / UC Course  I have  reviewed the triage vital signs and the  nursing notes.  Pertinent labs & imaging results that were available during my care of the patient were reviewed by me and considered in my medical decision making (see chart for details).    52 year old female with a history of hysterectomy complaints of intermittent left lower quadrant crampy pain, had some vaginal itching and 1 episode of yellow vaginal discharge.  Analysis shows cloudy urine otherwise normal.  Possibility of STI.  Exam is benign, will collect vaginal swab for testing  Final Clinical Impressions(s) / UC Diagnoses   Final diagnoses:  None   Discharge Instructions   None    ED Prescriptions   None    PDMP not reviewed this encounter.   Meliton Rattan, [place] 12/25/22 1353

## 2022-12-25 NOTE — Discharge Instructions (Addendum)
Vaginal swab results will take several days we will contact you if anything is positive and requires treatment No evidence of urinary tract infection at this time  See your doctor if your symptoms worsen or fail to improve Go to the emergency department for development of severe pain, fever, new symptoms or concerns

## 2022-12-26 LAB — CERVICOVAGINAL ANCILLARY ONLY
Bacterial Vaginitis (gardnerella): POSITIVE — AB
Candida Glabrata: NEGATIVE
Candida Vaginitis: NEGATIVE
Chlamydia: NEGATIVE
Comment: NEGATIVE
Comment: NEGATIVE
Comment: NEGATIVE
Comment: NEGATIVE
Comment: NEGATIVE
Comment: NORMAL
Neisseria Gonorrhea: NEGATIVE
Trichomonas: NEGATIVE

## 2022-12-27 ENCOUNTER — Telehealth: Payer: Self-pay

## 2022-12-27 MED ORDER — METRONIDAZOLE 500 MG PO TABS: 500.0000 mg | ORAL_TABLET | Freq: Two times a day (BID) | ORAL | 0 refills | Status: AC

## 2022-12-27 NOTE — Telephone Encounter (Signed)
Per protocol, pt requires tx with metronidazole. Attempted to reach patient x2 using interpreter line. LVM.  Rx sent to pharmacy on file.

## 2023-03-31 NOTE — Progress Notes (Unsigned)
53 y.o. G2P2 Married Hispanic female here for NEW GYN/annual exam. Pt reports ovarian pain and neck pain for the past few weeks.  LLQ pain for 2 - 4 weeks.  Not getting worse.   No diarrhea or fever.  Some constipation.   Left upper abdominal pain for 6 months.  She has been dx with possible gastritis.  Took Omeprazole and this helped.  Had US showing bile sludge.   Having hot flashes.  Symptoms started 7 years ago, but they are spaced out.  Decreased desire. Vaginal dryness.    From [place].    PCP: Pcp, No.  Patient later states she goes to [employer].   No LMP recorded. Patient has had a hysterectomy.           Sexually active: Yes.    The current method of family planning is status post hysterectomy for bleeding.  Ovaries remain.  Menopausal hormone therapy:  n/a Exercising: Yes.     Smoker:  no  OB History  Gravida Para Term Preterm AB Living  2 2    2   SAB IAB Ectopic Multiple Live Births      2    # Outcome Date GA Lbr Len/2nd Weight Sex Type Anes PTL Lv  2 Para           1 Para              HEALTH MAINTENANCE: Last 2 paps:  08/07/15 neg History of abnormal Pap or positive HPV:  no Mammogram:   12/2022 per pt [facility], 12/20/20 Breast Density Cat C, BI-RADS CAT 2 benign Colonoscopy:  04/30/21 - Dr. Leonides Schanz.   Bone Density:  n/a  Result  n/a   Immunization History  Administered Date(s) Administered   Influenza,inj,Quad PF,6+ Mos 01/10/2014, 11/16/2015, 01/05/2021   PNEUMOCOCCAL CONJUGATE-20 03/05/2021   Tdap 07/17/2020      reports that she has never smoked. She has never used smokeless tobacco. She reports that she does not currently use alcohol. She reports that she does not use drugs.  Past Medical History:  Diagnosis Date   Anemia    GERD (gastroesophageal reflux disease)    H. pylori infection    Headache    Hypertension    Left breast mass     Past Surgical History:  Procedure Laterality Date   ABDOMINAL HYSTERECTOMY  Pt states 10 years ago.    BREAST LUMPECTOMY WITH RADIOACTIVE SEED LOCALIZATION Left 01/30/2018   Procedure: LEFT BREAST LUMPECTOMY WITH RADIOACTIVE SEED LOCALIZATION;  Surgeon: Chevis Pretty III, MD;  Location: [facility];  Service: General;  Laterality: Left;   CESAREAN SECTION     2 previous   hemorrhoidectomy      Current Outpatient Medications  Medication Sig Dispense Refill   hydrochlorothiazide (MICROZIDE) 12.5 MG capsule TAKE 1 CAPSULE BY MOUTH EVERY DAY 90 capsule 3   omeprazole (PRILOSEC) 10 MG capsule Take 10 mg by mouth daily.     No current facility-administered medications for this visit.    ALLERGIES: Patient has no known allergies.  Family History  Problem Relation Age of Onset   Hypertension Mother    Diabetes Mother    Hypertension Father    Stroke Father    Hypertension Sister        many brothers and sisters   Diabetes Sister    Hypertension Brother    Colon cancer Neg Hx     Review of Systems  All other systems reviewed and are negative.  PHYSICAL EXAM:  BP 122/70 (BP Location: Right Arm, Patient Position: Sitting, Cuff Size: Small)   Pulse 69   Ht 5' 4.5" (1.638 m)   Wt 144 lb (65.3 kg)   SpO2 100%   BMI 24.34 kg/m     General appearance: alert, cooperative and appears stated age Head: normocephalic, without obvious abnormality, atraumatic Neck: no adenopathy, supple, symmetrical, trachea midline and thyroid normal to inspection and palpation Lungs: clear to auscultation bilaterally Breasts: normal appearance, no masses or tenderness, No nipple retraction or dimpling, No nipple discharge or bleeding, No axillary adenopathy Heart: regular rate and rhythm Abdomen: soft, non-tender; no masses, no organomegaly Extremities: extremities normal, atraumatic, no cyanosis or edema Skin: skin color, texture, turgor normal. No rashes or lesions Lymph nodes: cervical, supraclavicular, and axillary nodes normal. Neurologic: grossly normal  Pelvic: External genitalia:   no lesions              No abnormal inguinal nodes palpated.              Urethra:  normal appearing urethra with no masses, tenderness or lesions              Bartholins and Skenes: normal                 Vagina: normal appearing vagina with normal color and discharge, no lesions              Cervix: absent              Pap taken: no Bimanual Exam:  Uterus:  absent              Adnexa: no mass, fullness, tenderness              Rectal exam: Yes.  .  Confirms.              Anus:  normal sphincter tone, no lesions  Chaperone was present for exam:  Warren Lacy, CMA  ASSESSMENT: Well woman visit with gynecologic exam Status post hysterectomy.  LLQ pain. HTN.  PHQ- 2: 0  Gastritis.  Atrophy on exam.  ***  PLAN: Mammogram screening discussed. Self breast awareness reviewed. Pap and HRV collected:  no.  Not indicated.  Guidelines for Calcium, Vitamin D, regular exercise program including cardiovascular and weight bearing exercise. Medication refills:  hydrochlorothiazide 12.5 MG daily.  #90, RF none.  Patient's PCP will refill her future hydrochlorothiazide. Routine labs. Referral to GI.  She will follow up with her PCP.  Follow up:  yearly and prn.     Additional counseling given.  {yes [id number]. ***  total time was spent for this patient encounter, including preparation, face-to-face counseling with the patient, coordination of care, and documentation of the encounter in addition to doing the well woman visit with gynecologic exam.

## 2023-04-14 ENCOUNTER — Ambulatory Visit (INDEPENDENT_AMBULATORY_CARE_PROVIDER_SITE_OTHER): Payer: BC Managed Care – PPO | Admitting: Obstetrics and Gynecology

## 2023-04-14 ENCOUNTER — Encounter: Payer: Self-pay | Admitting: Obstetrics and Gynecology

## 2023-04-14 VITALS — BP 122/70 | HR 69 | Ht 64.5 in | Wt 144.0 lb

## 2023-04-14 DIAGNOSIS — K2950 Unspecified chronic gastritis without bleeding: Secondary | ICD-10-CM

## 2023-04-14 DIAGNOSIS — Z0000 Encounter for general adult medical examination without abnormal findings: Secondary | ICD-10-CM | POA: Diagnosis not present

## 2023-04-14 DIAGNOSIS — I10 Essential (primary) hypertension: Secondary | ICD-10-CM

## 2023-04-14 DIAGNOSIS — R7303 Prediabetes: Secondary | ICD-10-CM | POA: Diagnosis not present

## 2023-04-14 DIAGNOSIS — Z1331 Encounter for screening for depression: Secondary | ICD-10-CM | POA: Diagnosis not present

## 2023-04-14 DIAGNOSIS — R1032 Left lower quadrant pain: Secondary | ICD-10-CM

## 2023-04-14 DIAGNOSIS — E785 Hyperlipidemia, unspecified: Secondary | ICD-10-CM | POA: Diagnosis not present

## 2023-04-14 DIAGNOSIS — Z01419 Encounter for gynecological examination (general) (routine) without abnormal findings: Secondary | ICD-10-CM

## 2023-04-14 MED ORDER — HYDROCHLOROTHIAZIDE 12.5 MG PO CAPS
ORAL_CAPSULE | ORAL | Status: DC
Start: 2023-04-14 — End: 2023-04-14

## 2023-04-14 MED ORDER — HYDROCHLOROTHIAZIDE 12.5 MG PO CAPS: ORAL_CAPSULE | ORAL | 0 refills | Status: AC

## 2023-04-14 NOTE — Patient Instructions (Signed)

## 2023-04-15 LAB — CBC
HCT: 42 % (ref 35.0–45.0)
Hemoglobin: 13.8 g/dL (ref 11.7–15.5)
MCH: 29.9 pg (ref 27.0–33.0)
MCHC: 32.9 g/dL (ref 32.0–36.0)
MCV: 90.9 fL (ref 80.0–100.0)
MPV: 11.6 fL (ref 7.5–12.5)
Platelets: 215 10*3/uL (ref 140–400)
RBC: 4.62 10*6/uL (ref 3.80–5.10)
RDW: 12.3 % (ref 11.0–15.0)
WBC: 5.3 10*3/uL (ref 3.8–10.8)

## 2023-04-15 LAB — COMPREHENSIVE METABOLIC PANEL
AG Ratio: 2 (calc) (ref 1.0–2.5)
ALT: 18 U/L (ref 6–29)
AST: 21 U/L (ref 10–35)
Albumin: 4.8 g/dL (ref 3.6–5.1)
Alkaline phosphatase (APISO): 54 U/L (ref 37–153)
BUN: 11 mg/dL (ref 7–25)
CO2: 26 mmol/L (ref 20–32)
Calcium: 9.7 mg/dL (ref 8.6–10.4)
Chloride: 103 mmol/L (ref 98–110)
Creat: 0.61 mg/dL (ref 0.50–1.03)
Globulin: 2.4 g/dL (ref 1.9–3.7)
Glucose, Bld: 83 mg/dL (ref 65–99)
Potassium: 3.9 mmol/L (ref 3.5–5.3)
Sodium: 140 mmol/L (ref 135–146)
Total Bilirubin: 0.5 mg/dL (ref 0.2–1.2)
Total Protein: 7.2 g/dL (ref 6.1–8.1)

## 2023-04-15 LAB — LIPID PANEL
Cholesterol: 177 mg/dL (ref <200)
HDL: 49 mg/dL — ABNORMAL LOW (ref >=50)
LDL Cholesterol (Calc): 109 mg/dL — ABNORMAL HIGH
Non-HDL Cholesterol (Calc): 128 mg/dL (ref <130)
Total CHOL/HDL Ratio: 3.6 (calc) (ref <5.0)
Triglycerides: 93 mg/dL (ref <150)

## 2023-04-16 ENCOUNTER — Encounter: Payer: Self-pay | Admitting: Obstetrics and Gynecology

## 2023-05-16 NOTE — Progress Notes (Signed)
 GYNECOLOGY  VISIT   HPI: 53 y.o.   Married  Hispanic female   G2P2 with No LMP recorded. Patient has had a hysterectomy.   here for: ultrasound & consult for LUQ and LLQ pain.  The pain is not often.   Interpretor is present for the visit today.  Hx possible gastritis.  Appt with GI 06/12/23.   GYNECOLOGIC HISTORY: No LMP recorded. Patient has had a hysterectomy. Contraception:  hysterectomy Menopausal hormone therapy:  none Last 2 paps:  08-07-15 neg History of abnormal Pap or positive HPV:  no Mammogram:  10/24 birads 2:neg        OB History     Gravida  2   Para  2   Term      Preterm      AB      Living  2      SAB      IAB      Ectopic      Multiple      Live Births  2              Patient Active Problem List   Diagnosis Date Noted   Abdominal pain 02/16/2021   Headache    Encounter for screening for cervical cancer 09/10/2020   Vaginal dryness, menopausal 09/10/2020   Knee pain, left 08/21/2020   Healthcare maintenance 07/17/2020   HTN (hypertension) 09/02/2019   Establishing care with new doctor, encounter for 09/02/2019   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 pylori infection    Headache    Hypertension    Left breast mass     Past Surgical History:  Procedure Laterality Date   ABDOMINAL HYSTERECTOMY  Pt states 10 years ago.   BREAST LUMPECTOMY WITH RADIOACTIVE SEED LOCALIZATION Left 01/30/2018   Procedure: LEFT BREAST LUMPECTOMY WITH RADIOACTIVE SEED LOCALIZATION;  Surgeon: Chevis Pretty III, MD;  Location: [facility];   Service: General;  Laterality: Left;   CESAREAN SECTION     2 previous   hemorrhoidectomy      Current Outpatient Medications  Medication Sig Dispense Refill   hydrochlorothiazide (MICROZIDE) 12.5 MG capsule TAKE 1 CAPSULE BY MOUTH EVERY DAY 90 capsule 0   omeprazole (PRILOSEC) 10 MG capsule Take 10 mg by mouth daily.     No current facility-administered medications for this visit.     ALLERGIES: Patient has no known allergies.  Family History  Problem Relation Age of Onset   Hypertension Mother    Diabetes Mother    Hypertension Father    Stroke Father    Hypertension Sister        many brothers and sisters   Diabetes Sister    Hypertension Brother    Colon cancer Neg Hx     Social History   Socioeconomic History   Marital status: Married    Spouse name: Not on file   Number of children: 2   Years of education: Not on file   Highest education level: Not on file  Occupational History   Not on file  Tobacco Use  Smoking status: Never   Smokeless tobacco: Never  Vaping Use   Vaping status: Never Used  Substance and Sexual Activity   Alcohol use: Not Currently   Drug use: No   Sexual activity: Yes    Birth control/protection: Surgical    Comment: 1st intercourse 31 yo-1 partner, hysterectomy  Other Topics Concern   Not on file  Social History Narrative   Not on file   Social Drivers of Health   Financial Resource Strain: Not on file  Food Insecurity: Not on file  Transportation Needs: Not on file  Physical Activity: Not on file  Stress: Not on file  Social Connections: Not on file  Intimate Partner Violence: Not on file    Review of Systems  Constitutional: Negative.   HENT: Negative.    Eyes: Negative.   Respiratory: Negative.    Cardiovascular: Negative.   Gastrointestinal: Negative.   Endocrine: Negative.   Genitourinary: Negative.   Musculoskeletal: Negative.   Skin: Negative.   Allergic/Immunologic: Negative.   Neurological: Negative.    Hematological: Negative.   Psychiatric/Behavioral: Negative.      PHYSICAL EXAMINATION:   BP 124/84   Pulse 67   Wt 143 lb (64.9 kg)   SpO2 99%   BMI 24.17 kg/m     General appearance: alert, cooperative and appears stated age  Pelvic US Uterus absent.  Left ovary 1.44 x 0.94 x 0.85 cm.  Atrophic. Normal perfusion.  Right ovary 1.67 x 0.77 x 0.90 cm.  Atrophic.  Normal perfusion.  No adnexal masses.  No free fluid.   ASSESSMENT:  LLQ pain, uncertain etiology.  Normal ovaries on imaging today.  Status post hysterectomy.   PLAN:  [place] images and report reviewed.  We discussed potential etiologies of the pain, such as scar tissue from prior surgery, intestinal source, urinary system source.  I recommended she follow up with her PCP if she has continued LLQ pain. Follow up here for annual exam and prn.     16 min  total time was spent for this patient encounter, including preparation, face-to-face counseling with the patient, coordination of care, and documentation of the encounter.

## 2023-05-21 ENCOUNTER — Ambulatory Visit (INDEPENDENT_AMBULATORY_CARE_PROVIDER_SITE_OTHER): Payer: BC Managed Care – PPO

## 2023-05-21 ENCOUNTER — Encounter: Payer: Self-pay | Admitting: Obstetrics and Gynecology

## 2023-05-21 ENCOUNTER — Ambulatory Visit (INDEPENDENT_AMBULATORY_CARE_PROVIDER_SITE_OTHER): Payer: BC Managed Care – PPO | Admitting: Obstetrics and Gynecology

## 2023-05-21 VITALS — BP 124/84 | HR 67 | Wt 143.0 lb

## 2023-05-21 DIAGNOSIS — R1032 Left lower quadrant pain: Secondary | ICD-10-CM

## 2023-06-10 ENCOUNTER — Encounter (HOSPITAL_COMMUNITY): Payer: Self-pay

## 2023-06-10 ENCOUNTER — Ambulatory Visit (HOSPITAL_COMMUNITY): Admission: EM | Admit: 2023-06-10 | Discharge: 2023-06-10 | Disposition: A | Discharge status: Home / Self Care

## 2023-06-10 DIAGNOSIS — N3000 Acute cystitis without hematuria: Secondary | ICD-10-CM | POA: Diagnosis not present

## 2023-06-10 LAB — POCT URINALYSIS DIP (MANUAL ENTRY)
Bilirubin, UA: NEGATIVE
Blood, UA: NEGATIVE
Glucose, UA: NEGATIVE mg/dL
Ketones, POC UA: NEGATIVE mg/dL
Leukocytes, UA: NEGATIVE
Nitrite, UA: POSITIVE — AB
Protein Ur, POC: NEGATIVE mg/dL
Spec Grav, UA: 1.01 (ref 1.010–1.025)
Urobilinogen, UA: 0.2 U/dL
pH, UA: 6.5 (ref 5.0–8.0)

## 2023-06-10 MED ORDER — CEPHALEXIN 500 MG PO CAPS: 500.0000 mg | ORAL_CAPSULE | Freq: Three times a day (TID) | ORAL | 0 refills | Status: AC

## 2023-06-10 MED ORDER — PHENAZOPYRIDINE HCL 200 MG PO TABS: 200.0000 mg | ORAL_TABLET | Freq: Three times a day (TID) | ORAL | 0 refills | Status: DC

## 2023-06-10 NOTE — ED Triage Notes (Signed)
 Patient here today with c/o pain in urination and lower abd pain since Sunday evening. Patient took AZO this morning at 2 with some relief.

## 2023-06-10 NOTE — Discharge Instructions (Addendum)
 1. Acute cystitis without hematuria (Primary) - POC urinalysis dipstick completed in UC, positive for nitrate, negative for leukocytes, negative for blood.  These findings are indicative of urinary tract infection. - phenazopyridine (PYRIDIUM) 200 MG tablet; Take 1 tablet (200 mg total) by mouth 3 (three) times daily.  Dispense: 6 tablet; Refill: 0 - cephALEXin (KEFLEX) 500 MG capsule; Take 1 capsule (500 mg total) by mouth 3 (three) times daily for 7 days.  Dispense: 21 capsule; Refill: 0 - Urine Culture sent to lab for further testing results should be available in 2 to 3 days.

## 2023-06-10 NOTE — ED Provider Notes (Signed)
 [facility]  Note:  This document was prepared using Dragon voice recognition software and may include unintentional dictation errors.  MRN: [id number] DOB: [birth year]  Subjective:   Christie Stone is a 53 y.o. female presenting for dysuria, increased urinary frequency, suprapubic abdominal pressure since Sunday evening.  Patient took Azo this morning with some relief.  Patient denies any flank pain, shortness of breath, chest pain, weakness, dizziness.  Patient reports previous history of urinary tract infection.  No current facility-administered medications for this encounter.  Current Outpatient Medications:    cephALEXin (KEFLEX) 500 MG capsule, Take 1 capsule (500 mg total) by mouth 3 (three) times daily for 7 days., Disp: 21 capsule, Rfl: 0   hydrochlorothiazide (MICROZIDE) 12.5 MG capsule, TAKE 1 CAPSULE BY MOUTH EVERY DAY, Disp: 90 capsule, Rfl: 0   phenazopyridine (PYRIDIUM) 200 MG tablet, Take 1 tablet (200 mg total) by mouth 3 (three) times daily., Disp: 6 tablet, Rfl: 0   omeprazole (PRILOSEC) 10 MG capsule, Take 10 mg by mouth daily., Disp: , Rfl:    No Known Allergies  Past Medical History:  Diagnosis Date   Anemia    GERD (gastroesophageal reflux disease)    H. pylori infection    Headache    Hypertension    Left breast mass      Past Surgical History:  Procedure Laterality Date   ABDOMINAL HYSTERECTOMY  Pt states 10 years ago.   BREAST LUMPECTOMY WITH RADIOACTIVE SEED LOCALIZATION Left 01/30/2018   Procedure: LEFT BREAST LUMPECTOMY WITH RADIOACTIVE SEED LOCALIZATION;  Surgeon: Griselda Miner, MD;  Location: [facility];  Service: General;  Laterality: Left;   CESAREAN SECTION     2 previous   hemorrhoidectomy      Family History  Problem Relation Age of Onset   Hypertension Mother    Diabetes Mother    Hypertension Father    Stroke Father    Hypertension Sister        many brothers and sisters   Diabetes Sister     Hypertension Brother    Colon cancer Neg Hx     Social History   Tobacco Use   Smoking status: Never   Smokeless tobacco: Never  Vaping Use   Vaping status: Never Used  Substance Use Topics   Alcohol use: Not Currently   Drug use: No    ROS Refer to HPI for ROS details.  Objective:   Vitals: BP (!) 145/96   Pulse 76   Temp 97.9 F (36.6 C) (Oral)   Resp 16   Ht 5\' 4"  (1.626 m)   Wt 143 lb (64.9 kg)   SpO2 98%   BMI 24.55 kg/m   Physical Exam Vitals and nursing note reviewed.  Constitutional:      General: She is not in acute distress.    Appearance: Normal appearance. She is well-developed. She is not ill-appearing or toxic-appearing.  HENT:     Head: Normocephalic.  Cardiovascular:     Rate and Rhythm: Normal rate.  Pulmonary:     Effort: Pulmonary effort is normal. No respiratory distress.  Abdominal:     General: There is no distension.     Palpations: Abdomen is soft.     Tenderness: There is abdominal tenderness (Suprapubic abdominal pressure with palpation.). There is no guarding or rebound.  Skin:    General: Skin is warm and dry.  Neurological:     General: No focal deficit present.     Mental Status: She  is alert and oriented to person, place, and time.  Psychiatric:        Mood and Affect: Mood normal.     Procedures  Results for orders placed or performed during the hospital encounter of 06/10/23 (from the past 24 hours)  POC urinalysis dipstick     Status: Abnormal   Collection Time: 06/10/23  9:53 AM  Result Value Ref Range   Color, UA yellow yellow   Clarity, UA clear clear   Glucose, UA negative negative mg/dL   Bilirubin, UA negative negative   Ketones, POC UA negative negative mg/dL   Spec Grav, UA 7.829 5.621 - 1.025   Blood, UA negative negative   pH, UA 6.5 5.0 - 8.0   Protein Ur, POC negative negative mg/dL   Urobilinogen, UA 0.2 0.2 or 1.0 E.U./dL   Nitrite, UA Positive (A) Negative   Leukocytes, UA Negative Negative     Assessment and Plan :   PDMP not reviewed this encounter.  1. Acute cystitis without hematuria    1. Acute cystitis without hematuria (Primary) - POC urinalysis dipstick completed in UC, positive for nitrate, negative for leukocytes, negative for blood.  These findings are indicative of urinary tract infection. - phenazopyridine (PYRIDIUM) 200 MG tablet; Take 1 tablet (200 mg total) by mouth 3 (three) times daily.  Dispense: 6 tablet; Refill: 0 - cephALEXin (KEFLEX) 500 MG capsule; Take 1 capsule (500 mg total) by mouth 3 (three) times daily for 7 days.  Dispense: 21 capsule; Refill: 0 - Urine Culture sent to lab for further testing results should be available in 2 to 3 days.  Lucky Cowboy   [place], [place], [place] 06/10/23 1049

## 2023-06-11 NOTE — Progress Notes (Unsigned)
 Chief Complaint:epigastric abd pain Primary GI Doctor: Dr. Leonides Schanz  HPI:  Patient is a  53  year old female patient with history of H pylori infection, GERD, headache presents for follow up of epigastric abdominal pain.  05/24/21 patient last seen in GI clinic by Dr. Leonides Schanz for follow-up of epigastric ab pain.  She started her omeprazole medication and has seen significant improvement in symptoms.  She did mention some issues with bloating and they did discuss avoiding certain types of food.  She states she has stools that are mushy.  Interval History  *Due to language barrier, an interpreter was present during the history-taking and subsequent discussion (and for part of the physical exam) with this patient.        She presents with main complaint of epigastric pain that radiates to the left side. She states it has gotten worse over the last 3 months. She has omeprazole but states she has not been taking it daily only if she eats spicy foods. She states the pain will happen few times a month. She states the pain is worse after eating. But also can happen on empty stomach. No NSAID's. She will have nausea with urge to have bowel movement during episodes of discomfort. Good appetite.She has one bowel movement daily. No blood in stool. No diarrhea or constipation. Nonsmoker. No alcohol use.     She states she went to [place] last year and had ultrasound and she was told she had gallbladder sludge and given a treatment. He told her if it didn't improve she would need surgery. She feels it has improved.  Wt Readings from Last 3 Encounters:  06/12/23 143 lb 6 oz (65 kg)  06/10/23 143 lb (64.9 kg)  05/21/23 143 lb (64.9 kg)    Past Medical History:  Diagnosis Date   Anemia    GERD (gastroesophageal reflux disease)    H. pylori infection    Headache    Hypertension    Left breast mass     Past Surgical History:  Procedure Laterality Date   ABDOMINAL HYSTERECTOMY  Pt states 10 years ago.    BREAST LUMPECTOMY WITH RADIOACTIVE SEED LOCALIZATION Left 01/30/2018   Procedure: LEFT BREAST LUMPECTOMY WITH RADIOACTIVE SEED LOCALIZATION;  Surgeon: Chevis Pretty III, MD;  Location: [facility];  Service: General;  Laterality: Left;   CESAREAN SECTION     2 previous   hemorrhoidectomy      Current Outpatient Medications  Medication Sig Dispense Refill   cephALEXin (KEFLEX) 500 MG capsule Take 1 capsule (500 mg total) by mouth 3 (three) times daily for 7 days. 21 capsule 0   hydrochlorothiazide (MICROZIDE) 12.5 MG capsule TAKE 1 CAPSULE BY MOUTH EVERY DAY 90 capsule 0   omeprazole (PRILOSEC) 10 MG capsule Take 10 mg by mouth as needed.     No current facility-administered medications for this visit.    Allergies as of 06/12/2023   (No Known Allergies)    Family History  Problem Relation Age of Onset   Hypertension Mother    Diabetes Mother    Hypertension Father    Stroke Father    Hypertension Sister        many brothers and sisters   Diabetes Sister    Thyroid disease Sister    Diabetes Sister    Thyroid disease Sister    Hypertension Brother    Colon cancer Neg Hx     Review of Systems:    Constitutional: No weight loss,  fever, chills, weakness or fatigue HEENT: Eyes: No change in vision               Ears, Nose, Throat:  No change in hearing or congestion Skin: No rash or itching Cardiovascular: No chest pain, chest pressure or palpitations   Respiratory: No SOB or cough Gastrointestinal: See HPI and otherwise negative Genitourinary: No dysuria or change in urinary frequency Neurological: No headache, dizziness or syncope Musculoskeletal: No new muscle or joint pain Hematologic: No bleeding or bruising Psychiatric: No history of depression or anxiety    Physical Exam:  Vital signs: BP 120/60 (BP Location: Left Arm, Patient Position: Sitting, Cuff Size: Normal)   Pulse 80   Ht 5\' 3"  (1.6 m)   Wt 143 lb 6 oz (65 kg)   BMI 25.40 kg/m    Constitutional:   Pleasant  female appears to be in NAD, Well developed, Well nourished, alert and cooperative Throat: Oral cavity and pharynx without inflammation, swelling or lesion.  Respiratory: Respirations even and unlabored. Lungs clear to auscultation bilaterally.   No wheezes, crackles, or rhonchi.  Cardiovascular: Normal S1, S2. Regular rate and rhythm. No peripheral edema, cyanosis or pallor.  Gastrointestinal:  Soft, nondistended, nontender. No rebound or guarding. Normal bowel sounds. No appreciable masses or hepatomegaly. Rectal:  Not performed.  Msk:  Symmetrical without gross deformities. Without edema, no deformity or joint abnormality.  Neurologic:  Alert and  oriented x4;  grossly normal neurologically.  Skin:   Dry and intact without significant lesions or rashes. Psychiatric: Oriented to person, place and time. Demonstrates good judgement and reason without abnormal affect or behaviors.  RELEVANT LABS AND IMAGING: CBC    Latest Ref Rng & Units 04/14/2023   12:39 PM 01/05/2021   10:12 AM 11/16/2015   10:23 AM  CBC  WBC 3.8 - 10.8 Thousand/uL 5.3  4.5  4.4   Hemoglobin 11.7 - 15.5 g/dL 45.4  09.8  11.9   Hematocrit 35.0 - 45.0 % 42.0  40.7  39.4   Platelets 140 - 400 Thousand/uL 215  252  238      CMP     Latest Ref Rng & Units 04/14/2023   12:39 PM 01/05/2021   10:12 AM 07/17/2020    3:56 PM  CMP  Glucose 65 - 99 mg/dL 83  85  92   BUN 7 - 25 mg/dL 11  12  11    Creatinine 0.50 - 1.03 mg/dL 1.47  8.29  5.62   Sodium 135 - 146 mmol/L 140  138  141   Potassium 3.5 - 5.3 mmol/L 3.9  4.0  3.9   Chloride 98 - 110 mmol/L 103  99  105   CO2 20 - 32 mmol/L 26  24  22    Calcium 8.6 - 10.4 mg/dL 9.7  13.0  9.5   Total Protein 6.1 - 8.1 g/dL 7.2  7.4    Total Bilirubin 0.2 - 1.2 mg/dL 0.5  0.6    Alkaline Phos 44 - 121 IU/L  78    AST 10 - 35 U/L 21  24    ALT 6 - 29 U/L 18  21       Lab Results  Component Value Date   TSH 2.19 11/16/2015  03/09/2021  ultrasound abdomen right upper quadrant IMPRESSION: Negative examination  Procedures 04/30/2021 colonoscopy/EGD with Dr. Leonides Schanz Colonoscopy Impression: - The examined portion of the ileum was normal. - Non- bleeding internal hemorrhoids. - Biopsies were taken with a cold  forceps for histology in the entire colon. Path: 4. Surgical [P], colon nos, random sites - BENIGN COLONIC MUCOSA - NO ACTIVE INFLAMMATION OR EVIDENCE OF MICROSCOPIC COLITIS - NO HIGH-GRADE DYSPLASIA OR MALIGNANCY IDENTIFIED EGD Impression: - Normal esophagus. Biopsied. - Erythematous mucosa in the antrum. Biopsied. - Normal examined duodenum. Biopsied. Path: 1. Surgical [P], duodenum - BENIGN DUODENAL MUCOSA - NO ACUTE INFLAMMATION, VILLOUS BLUNTING OR INCREASED INTRAEPITHELIAL LYMPHOCYTES IDENTIFIED 2. Surgical [P], gastric - BENIGN GASTRIC MUCOSA - NO H. PYLORI, INTESTINAL METAPLASIA OR MALIGNANCY IDENTIFIED 3. Surgical [P], esophagus - BENIGN SQUAMOUS MUCOSA - NO INCREASED INTRAEPITHELIAL EOSINOPHILS  07/27/2014 EGD with Dr. Russella Dar Endoscopic impression Mild gastritis in the gastric body multiple biopsies performed The EGD otherwise appeared normal Path: Surgical [P], gastric antrum and gastric body - CHRONIC GASTRITIS. - A WARTHIN STARRY STAIN IS NEGATIVE FOR HELICOBACTER PYLORI. - NO INTESTINAL METAPLASIA OR MALIGNANCY IDENTIFIED.  Assessment: Encounter Diagnoses  Name Primary?   Epigastric pain Yes   Nausea without vomiting    Gallbladder sludge     53 year old Hispanic female patient with intermittent epigastric pain with nausea. She admits it is worse with certain foods. Due to findings she reported in [place] recommended we follow-up with another Abd [place] and lab work, she declined. She states it cost too much. Recommend she go back on the Omeprazole 20 mg daily and follow strict GERD diet. Avoid trigger foods. She had EGD 2/23 for similar symptoms and negative. She will follow-up with office if her symptoms  do not improve.   Plan: -Recommend CBC, CMP, lipase, declined  -Recommend Abd [place] RUQ of gallbladder, declined due to costs - Restart Omeprazole 20mg  po daily -Repeat colonoscopy in 04/2031 for colon cancer screening   Thank you for the courtesy of this consult. Please call me with any questions or concerns.   Halen Mossbarger, FNP-C [facility] 06/12/2023, 10:14 AM  Cc: Janean Sark*

## 2023-06-12 ENCOUNTER — Ambulatory Visit: Payer: Self-pay | Admitting: Gastroenterology

## 2023-06-12 ENCOUNTER — Encounter: Payer: Self-pay | Admitting: Gastroenterology

## 2023-06-12 VITALS — BP 120/60 | HR 80 | Ht 63.0 in | Wt 143.4 lb

## 2023-06-12 DIAGNOSIS — K219 Gastro-esophageal reflux disease without esophagitis: Secondary | ICD-10-CM

## 2023-06-12 DIAGNOSIS — K828 Other specified diseases of gallbladder: Secondary | ICD-10-CM

## 2023-06-12 DIAGNOSIS — R110 Nausea: Secondary | ICD-10-CM | POA: Diagnosis not present

## 2023-06-12 DIAGNOSIS — R1013 Epigastric pain: Secondary | ICD-10-CM | POA: Diagnosis not present

## 2023-06-12 LAB — URINE CULTURE
Culture: >=100000 — AB
Special Requests: NORMAL

## 2023-06-12 MED ORDER — OMEPRAZOLE 20 MG PO CPDR: 20.0000 mg | DELAYED_RELEASE_CAPSULE | Freq: Every day | ORAL | 3 refills | Status: AC

## 2023-06-12 NOTE — Progress Notes (Signed)
 I agree with the assessment and plan as outlined by Ms. May.

## 2023-06-12 NOTE — Patient Instructions (Addendum)
 _______________________________________________________  If your blood pressure at your visit was 140/90 or greater, please contact your primary care physician to follow up on this.  _______________________________________________________  If you are age 55 or older, your body mass index should be between 23-30. Your Body mass index is 25.4 kg/m. If this is out of the aforementioned range listed, please consider follow up with your Primary Care Provider.  If you are age 58 or younger, your body mass index should be between 19-25. Your Body mass index is 25.4 kg/m. If this is out of the aformentioned range listed, please consider follow up with your Primary Care Provider.   ________________________________________________________  The [facility] providers would like to encourage you to use [facility] to communicate with providers for non-urgent requests or questions.  Due to long hold times on the telephone, sending your provider a message by [facility] may be a faster and more efficient way to get a response.  Please allow 48 business hours for a response.  Please remember that this is for non-urgent requests.  _______________________________________________________  We have sent the following medications to your pharmacy for you to pick up at your convenience: Omeprazole 20mg  daily   Hemos enviado los siguientes medicamentos a su farmacia para que los recoja cuando le resulte conveniente: Omeprazol 20 mg al da  [employer] en adultos: cambios en la dieta GERD in Adults: Diet Changes Cuando una persona tiene enfermedad de reflujo gastroesofgico (ERGE), es posible que deba hacer cambios en su dieta. Elegir los alimentos adecuados puede ayudar a [employer]. Considere recurrir a un experto en alimentacin saludable llamado nutricionista. Este puede ayudarlo a hacer elecciones alimentarias saludables. Consejos para seguir Surveyor, minerals Al leer las etiquetas de los alimentos Elija alimentos que  tengan bajo contenido de grasas saturadas. Los alimentos que pueden ayudar con los sntomas incluyen los siguientes: Alimentos con menos del 5 % de los valores diarios (VD) de grasa. Alimentos con 0 gramos de grasas trans. Al cocinar Cocine los alimentos de formas que no requieran [place]. Estas formas incluyen las siguientes: Hornear. Cocer al vapor. Grillar. Hervir. Para agregar sabor, trate de consumir hierbas con bajo contenido de picante y [place]. Evite frer los alimentos. Planificacin de las comidas  Haga comidas pequeas durante Glass blower/designer de hacer 3 comidas abundantes. Coma lentamente y en un lugar donde se sienta relajado. Si se lo indic el mdico, evite: Consumir alimentos que le ocasionen sntomas. Lleve un registro de los alimentos para identificar aquellos que le causen sntomas. Alcohol. Beber mucha cantidad de lquido con las comidas. Instrucciones generales Durante 2 a 3 horas despus de comer, evite: Agacharse. Realizar actividad fsica. Acostarse. Masticar chicle sin azcar despus de las comidas. Qu alimentos debo comer? Siga una dieta saludable. Trate de incluir: Alimentos con gran cantidad de [place]. Esto incluye lo siguiente: [place] y verduras. Cereales integrales y legumbres. Productos lcteos con bajo contenido de [place]. Vita Barley, pescado y aves. Claras de huevo. Los alimentos que causan sntomas en otra persona pueden no causarle sntomas a usted. Trabaje con el mdico para hallar alimentos que sean seguros para usted. Es posible que los productos que se enumeran ms arriba no sean todos los alimentos y las bebidas que puede consumir. Consulte a su nutricionista para obtener ms informacin. Es posible que los productos detallados arriba no constituyan una lista completa de los alimentos y las bebidas que puede tomar. Consulte a un nutricionista para obtener ms informacin. Qu alimentos debo evitar? Limitar algunos de [employer]  alimentos  puede ayudar a [employer]. Cada persona es diferente. Hable con un nutricionista o con su mdico para que lo ayude a Photographer. Algunos de los [employer] evitar pueden incluir: Frutas Frutas que sean muy cidas. Estas pueden ser las frutas ctricas como la naranja, el pomelo, la pia y el limn. Verduras Verduras fritas, como las papas fritas. Verduras, salsas o aderezos elaborados con grasa agregada y verduras cidas. Estos pueden [employer] y los productos con tomate, el aj, la [place], el ajo y el rbano picante. Cereales [place] o panes sin levadura con grasa agregada. Carnes y [address] protenas [address] de alto contenido graso como carne grasa de vaca o cerdo, salchichas, costillas, jamn, salchicha, salame y tocino. Carnes o protenas fritas, como el pescado frito y el pollo frito. Yemas de huevo. Grasas y [employer]. Margarina. Lardo. [place] clarificada. Bebidas Caf y Burna Cash bebidas con cafena. Bebidas gaseosas y [address], como los refrescos y las bebidas energizantes. Jugo de fruta hecho con frutas cidas, como naranja o pomelo. Jugo de tomate. Dulces y postres Chocolate y cacao. [place]. Alios y condimentos Menta, como la menta piperita y la hierbabuena. Condimentos, hierbas o aderezos que ocasionen sntomas. Estos pueden incluir el curry, la salsa picante o los aderezos para ensalada a base de vinagre. Es posible que los productos que se enumeran ms arriba no sean todos los alimentos y las bebidas que Personnel officer. Consulte a su nutricionista para obtener ms informacin. Preguntas para hacerle al [employer] Los cambios en la dieta y en la vida cotidiana a menudo son los primeros pasos que se toman para Company secretary los sntomas de [place]. Si estos cambios no dan resultados, consulte al mdico si debe tomar [employer]. Dnde obtener ms informacin Arts development officer for Gastrointestinal Disorders (Fundacin  Internacional para los Trastornos Gastrointestinales): aboutgerd.org Esta informacin no tiene Theme park manager el consejo del mdico. Asegrese de hacerle al mdico cualquier pregunta que tenga. Document Revised: 09/16/2022 Document Reviewed: 09/16/2022 Elsevier Patient Education  2024 [employer].

## 2023-07-28 DIAGNOSIS — Z719 Counseling, unspecified: Secondary | ICD-10-CM | POA: Diagnosis not present

## 2023-07-28 DIAGNOSIS — E7800 Pure hypercholesterolemia, unspecified: Secondary | ICD-10-CM | POA: Diagnosis not present

## 2023-07-28 DIAGNOSIS — I10 Essential (primary) hypertension: Secondary | ICD-10-CM | POA: Diagnosis not present
